# Patient Record
Sex: Male | Born: 1935 | Hispanic: No | Smoking: Current every day smoker
Health system: Southern US, Community
[De-identification: ages and names within clinical notes are randomized; demographics above are authoritative.]

---

## 2019-04-24 ENCOUNTER — Other Ambulatory Visit: Payer: Self-pay

## 2019-04-24 ENCOUNTER — Emergency Department (HOSPITAL_COMMUNITY): Payer: Medicare Other

## 2019-04-24 ENCOUNTER — Encounter (HOSPITAL_COMMUNITY): Payer: Self-pay

## 2019-04-24 ENCOUNTER — Inpatient Hospital Stay (HOSPITAL_COMMUNITY)
Admission: EM | Admit: 2019-04-24 | Discharge: 2019-04-26 | DRG: 190 | Disposition: A | Discharge status: Home / Self Care | Payer: Medicare Other | Attending: Internal Medicine | Admitting: Internal Medicine

## 2019-04-24 DIAGNOSIS — I2510 Atherosclerotic heart disease of native coronary artery without angina pectoris: Secondary | ICD-10-CM | POA: Diagnosis present

## 2019-04-24 DIAGNOSIS — J441 Chronic obstructive pulmonary disease with (acute) exacerbation: Secondary | ICD-10-CM | POA: Diagnosis not present

## 2019-04-24 DIAGNOSIS — R0902 Hypoxemia: Secondary | ICD-10-CM

## 2019-04-24 DIAGNOSIS — M8458XA Pathological fracture in neoplastic disease, other specified site, initial encounter for fracture: Secondary | ICD-10-CM | POA: Diagnosis present

## 2019-04-24 DIAGNOSIS — Z20828 Contact with and (suspected) exposure to other viral communicable diseases: Secondary | ICD-10-CM | POA: Diagnosis present

## 2019-04-24 DIAGNOSIS — D509 Iron deficiency anemia, unspecified: Secondary | ICD-10-CM | POA: Diagnosis present

## 2019-04-24 DIAGNOSIS — C3491 Malignant neoplasm of unspecified part of right bronchus or lung: Secondary | ICD-10-CM | POA: Diagnosis not present

## 2019-04-24 DIAGNOSIS — F17200 Nicotine dependence, unspecified, uncomplicated: Secondary | ICD-10-CM | POA: Diagnosis present

## 2019-04-24 DIAGNOSIS — C3411 Malignant neoplasm of upper lobe, right bronchus or lung: Secondary | ICD-10-CM | POA: Diagnosis present

## 2019-04-24 DIAGNOSIS — J9601 Acute respiratory failure with hypoxia: Secondary | ICD-10-CM

## 2019-04-24 LAB — BASIC METABOLIC PANEL WITH GFR
Anion gap: 13 (ref 5–15)
BUN: 27 mg/dL — ABNORMAL HIGH (ref 8–23)
CO2: 26 mmol/L (ref 22–32)
Calcium: 8.7 mg/dL — ABNORMAL LOW (ref 8.9–10.3)
Chloride: 96 mmol/L — ABNORMAL LOW (ref 98–111)
Creatinine, Ser: 0.92 mg/dL (ref 0.61–1.24)
GFR calc Af Amer: >60 mL/min
GFR calc non Af Amer: >60 mL/min
Glucose, Bld: 141 mg/dL — ABNORMAL HIGH (ref 70–99)
Potassium: 4.3 mmol/L (ref 3.5–5.1)
Sodium: 135 mmol/L (ref 135–145)

## 2019-04-24 LAB — CBC WITH DIFFERENTIAL/PLATELET
Abs Immature Granulocytes: 0.07 10*3/uL (ref 0.00–0.07)
Basophils Absolute: 0.1 10*3/uL (ref 0.0–0.1)
Basophils Relative: 1 %
Eosinophils Absolute: 0.3 10*3/uL (ref 0.0–0.5)
Eosinophils Relative: 3 %
HCT: 33 % — ABNORMAL LOW (ref 39.0–52.0)
Hemoglobin: 9.9 g/dL — ABNORMAL LOW (ref 13.0–17.0)
Immature Granulocytes: 1 %
Lymphocytes Relative: 9 %
Lymphs Abs: 1 10*3/uL (ref 0.7–4.0)
MCH: 21.6 pg — ABNORMAL LOW (ref 26.0–34.0)
MCHC: 30 g/dL (ref 30.0–36.0)
MCV: 71.9 fL — ABNORMAL LOW (ref 80.0–100.0)
Monocytes Absolute: 1.1 10*3/uL — ABNORMAL HIGH (ref 0.1–1.0)
Monocytes Relative: 10 %
Neutro Abs: 8.9 10*3/uL — ABNORMAL HIGH (ref 1.7–7.7)
Neutrophils Relative %: 76 %
Platelets: 353 10*3/uL (ref 150–400)
RBC: 4.59 MIL/uL (ref 4.22–5.81)
RDW: 16.9 % — ABNORMAL HIGH (ref 11.5–15.5)
WBC: 11.5 10*3/uL — ABNORMAL HIGH (ref 4.0–10.5)
nRBC: 0 % (ref 0.0–0.2)

## 2019-04-24 LAB — SARS CORONAVIRUS 2 BY RT PCR (HOSPITAL ORDER, PERFORMED IN ~~LOC~~ HOSPITAL LAB): SARS Coronavirus 2: NEGATIVE

## 2019-04-24 LAB — TROPONIN I: Troponin I: <0.03 ng/mL

## 2019-04-24 MED ORDER — IPRATROPIUM BROMIDE HFA 17 MCG/ACT IN AERS
2.0000 | INHALATION_SPRAY | Freq: Once | RESPIRATORY_TRACT | Status: AC
  Administered 2019-04-24: 10:00:00 2 via RESPIRATORY_TRACT
  Filled 2019-04-24: qty 12.9

## 2019-04-24 MED ORDER — METHYLPREDNISOLONE SODIUM SUCC 40 MG IJ SOLR
40.0000 mg | Freq: Two times a day (BID) | INTRAMUSCULAR | Status: DC
  Administered 2019-04-24 – 2019-04-26 (×4): 40 mg via INTRAVENOUS
  Filled 2019-04-24 (×4): qty 1

## 2019-04-24 MED ORDER — METHYLPREDNISOLONE SODIUM SUCC 125 MG IJ SOLR
125.0000 mg | Freq: Once | INTRAMUSCULAR | Status: AC
  Administered 2019-04-24: 11:00:00 125 mg via INTRAVENOUS
  Filled 2019-04-24: qty 2

## 2019-04-24 MED ORDER — VITAMIN D 25 MCG (1000 UNIT) PO TABS
1000.0000 [IU] | ORAL_TABLET | Freq: Every day | ORAL | Status: DC
  Administered 2019-04-24 – 2019-04-26 (×3): 1000 [IU] via ORAL
  Filled 2019-04-24 (×3): qty 1

## 2019-04-24 MED ORDER — ACETAMINOPHEN 325 MG PO TABS
650.0000 mg | ORAL_TABLET | Freq: Four times a day (QID) | ORAL | Status: DC | PRN
  Administered 2019-04-25: 21:00:00 650 mg via ORAL
  Filled 2019-04-24: qty 2

## 2019-04-24 MED ORDER — ALBUTEROL SULFATE HFA 108 (90 BASE) MCG/ACT IN AERS
2.0000 | INHALATION_SPRAY | Freq: Once | RESPIRATORY_TRACT | Status: AC
  Administered 2019-04-24: 10:00:00 2 via RESPIRATORY_TRACT
  Filled 2019-04-24: qty 6.7

## 2019-04-24 MED ORDER — AZITHROMYCIN 250 MG PO TABS
250.0000 mg | ORAL_TABLET | Freq: Every day | ORAL | Status: DC
  Administered 2019-04-25 – 2019-04-26 (×2): 250 mg via ORAL
  Filled 2019-04-24 (×2): qty 1

## 2019-04-24 MED ORDER — IOHEXOL 300 MG/ML  SOLN: 75.0000 mL | Freq: Once | INTRAMUSCULAR | Status: DC | PRN

## 2019-04-24 MED ORDER — ALBUTEROL SULFATE (2.5 MG/3ML) 0.083% IN NEBU
2.5000 mg | INHALATION_SOLUTION | RESPIRATORY_TRACT | Status: DC | PRN
  Administered 2019-04-24: 16:00:00 2.5 mg via RESPIRATORY_TRACT
  Filled 2019-04-24: qty 3

## 2019-04-24 MED ORDER — ACETAMINOPHEN 650 MG RE SUPP: 650.0000 mg | Freq: Four times a day (QID) | RECTAL | Status: DC | PRN

## 2019-04-24 MED ORDER — IPRATROPIUM-ALBUTEROL 0.5-2.5 (3) MG/3ML IN SOLN
3.0000 mL | Freq: Four times a day (QID) | RESPIRATORY_TRACT | Status: DC
  Administered 2019-04-24 – 2019-04-26 (×8): 3 mL via RESPIRATORY_TRACT
  Filled 2019-04-24 (×8): qty 3

## 2019-04-24 MED ORDER — IOHEXOL 300 MG/ML  SOLN
75.0000 mL | Freq: Once | INTRAMUSCULAR | Status: AC | PRN
  Administered 2019-04-24: 12:00:00 75 mL via INTRAVENOUS

## 2019-04-24 MED ORDER — AZITHROMYCIN 250 MG PO TABS
500.0000 mg | ORAL_TABLET | Freq: Every day | ORAL | Status: AC
  Administered 2019-04-24: 18:00:00 500 mg via ORAL
  Filled 2019-04-24: qty 2

## 2019-04-24 MED ORDER — AEROCHAMBER PLUS FLO-VU MEDIUM MISC
1.0000 | Freq: Once | Status: AC
  Administered 2019-04-24: 10:00:00 1
  Filled 2019-04-24: qty 1

## 2019-04-24 MED ORDER — IPRATROPIUM-ALBUTEROL 0.5-2.5 (3) MG/3ML IN SOLN
3.0000 mL | Freq: Once | RESPIRATORY_TRACT | Status: AC
  Administered 2019-04-24: 12:00:00 3 mL via RESPIRATORY_TRACT
  Filled 2019-04-24: qty 3

## 2019-04-24 NOTE — ED Notes (Signed)
Call to resp 

## 2019-04-24 NOTE — ED Provider Notes (Signed)
Medical screening examination/treatment/procedure(s) were conducted as a shared visit with non-physician practitioner(s) and myself.  I personally evaluated the patient during the encounter.  EKG Interpretation  Date/Time:  Sunday Apr 24 2019 09:18:47 EDT Ventricular Rate:  104 PR Interval:    QRS Duration: 89 QT Interval:  339 QTC Calculation: 446 R Axis:   81 Text Interpretation:  Sinus tachycardia Borderline right axis deviation No previous ECGs available Interpretation limited secondary to artifact Confirmed by Fredia Sorrow [phone]) on 04/24/2019 9:24:09 AM   Patient seen by me along with physician assistant.  Patient is moved up here from [place] to be with family about 3 weeks ago.  Is been having a cough and shortness of breath for a while got worse last night.  Initially our thoughts were that patient may be a COPD exacerbation.  But chest x-ray raise significant concern for a neoplastic process for postobstructive pneumonia or both.  Patient nontoxic no acute distress.  COVID testing was negative.  Patient did have an oxygen requirement not normally on oxygen.  His room air sats of particular with ambulation were as low as 75%.  On 2-1/2 L oxygen levels are around 95%.  Patient much more comfortable.  Patient was ago given albuterol inhaler treatments.  Based on the chest x-ray results.  Patient will undergo CT of the chest with contrast just to give further evaluation about the neoplastic possible process.  Patient will require admission because of the hypoxia.  Patient currently comfortable nontoxic.   Fredia Sorrow, MD 04/24/19 [phone]

## 2019-04-24 NOTE — ED Notes (Signed)
Meal provided 

## 2019-04-24 NOTE — ED Notes (Signed)
Bed ready  Call for report   RN will call back

## 2019-04-24 NOTE — ED Notes (Signed)
Report to [place], [place]

## 2019-04-24 NOTE — ED Triage Notes (Signed)
Pt reports is here from [place].  Has been here for approx 3 weeks.  Reports no cough but sob for " a while" but worse last night.  Denies fever.

## 2019-04-24 NOTE — ED Notes (Signed)
Call to Daughter Minoru Chap [phone]  To provide room number and number to floor for updates on her father

## 2019-04-24 NOTE — ED Notes (Signed)
hospitalist in to assess/discuss findings

## 2019-04-24 NOTE — ED Notes (Signed)
From CT 

## 2019-04-24 NOTE — ED Provider Notes (Signed)
[facility] EMERGENCY DEPARTMENT Provider Note   CSN: [id number] Arrival date & time: 04/24/19  0909    History   Chief Complaint Chief Complaint  Patient presents with  . Shortness of Breath    HPI Charles Stafford is a 85 y.o. male who presents with SOB. PMH is unknown (pt states he doesn't take any medicines and is not diagnosed with anything). He is an overall poor historian and has some dysarthria which limits my history.  He came to [facility] about 3 weeks ago from [place]. He is currently living with his daughter, Charles Stafford. He reports he is here because he is SOB. He states he is always SOB and has a chronic cough. This has been worsening so he came to the ED today. He denies fever. He has chest pain with coughing. He smokes about 3-4 packs a day. Charles Stafford states that he has been complaining about a pain over his right side, in the axillary region. She has been giving him Aleve for this. He has been having increasing difficulty walking because of SOB and last night he couldn't get from the commode to his bed without a lot of difficulty. He also has a decreased appetite and is only drinking milk. She is unsure of his medical history because she is meeting him for the first time.   HPI  History reviewed. No pertinent past medical history.  There are no active problems to display for this patient.   History reviewed. No pertinent surgical history.      Home Medications    Prior to Admission medications   Not on File    Family History No family history on file.  Social History Social History   Tobacco Use  . Smoking status: Current Every Day Smoker  . Smokeless tobacco: Never Used  Substance Use Topics  . Alcohol use: Never    Frequency: Never  . Drug use: Never     Allergies   Patient has no known allergies.   Review of Systems Review of Systems  Constitutional: Positive for appetite change. Negative for fever.  Respiratory: Positive for cough, shortness of  breath and wheezing.   Cardiovascular: Positive for chest pain. Negative for palpitations and leg swelling.  Gastrointestinal: Negative for abdominal pain, nausea and vomiting.  Neurological: Negative for headaches.  All other systems reviewed and are negative.    Physical Exam Updated Vital Signs BP (!) 136/57 (BP Location: Left Arm)   Pulse 99   Temp 98 F (36.7 C) (Oral)   Resp (!) 27   SpO2 95%   Physical Exam Vitals signs and nursing note reviewed.  Constitutional:      General: He is not in acute distress.    Appearance: He is well-developed. He is ill-appearing (chronically ill appearing).     Comments: Alert, calm, cooperative. Speaks english fluently with significant dysarthria  HENT:     Head: Normocephalic and atraumatic.     Mouth/Throat:     Comments: Poor dentition Eyes:     General: No scleral icterus.       Right eye: No discharge.        Left eye: No discharge.     Conjunctiva/sclera: Conjunctivae normal.     Pupils: Pupils are equal, round, and reactive to light.  Neck:     Musculoskeletal: Normal range of motion.  Cardiovascular:     Rate and Rhythm: Normal rate and regular rhythm.  Pulmonary:     Effort: Pulmonary effort is normal. No  respiratory distress.     Breath sounds: Wheezing (diffuse wheezing, decreased air movement) present.  Abdominal:     General: There is no distension.  Skin:    General: Skin is warm and dry.  Neurological:     Mental Status: He is alert and oriented to person, place, and time.  Psychiatric:        Behavior: Behavior normal.      ED Treatments / Results  Labs (all labs ordered are listed, but only abnormal results are displayed) Labs Reviewed  CBC WITH DIFFERENTIAL/PLATELET - Abnormal; Notable for the following components:      Result Value   WBC 11.5 (*)    Hemoglobin 9.9 (*)    HCT 33.0 (*)    MCV 71.9 (*)    MCH 21.6 (*)    RDW 16.9 (*)    Neutro Abs 8.9 (*)    Monocytes Absolute 1.1 (*)    All  other components within normal limits  SARS CORONAVIRUS 2 (HOSPITAL ORDER, [place] [facility])  BASIC METABOLIC PANEL  TROPONIN I    EKG EKG Interpretation  Date/Time:  Sunday Apr 24 2019 09:18:47 EDT Ventricular Rate:  104 PR Interval:    QRS Duration: 89 QT Interval:  339 QTC Calculation: 446 R Axis:   81 Text Interpretation:  Sinus tachycardia Borderline right axis deviation No previous ECGs available Interpretation limited secondary to artifact Confirmed by Fredia Sorrow [phone]) on 04/24/2019 9:24:09 AM   Radiology No results found.  Procedures Procedures (including critical care time)  Medications Ordered in ED Medications  albuterol (VENTOLIN HFA) 108 (90 Base) MCG/ACT inhaler 2 puff (has no administration in time range)  ipratropium (ATROVENT HFA) inhaler 2 puff (has no administration in time range)  AeroChamber Plus Flo-Vu Medium MISC 1 each (has no administration in time range)     Initial Impression / Assessment and Plan / ED Course  I have reviewed the triage vital signs and the nursing notes.  Pertinent labs & imaging results that were available during my care of the patient were reviewed by me and considered in my medical decision making (see chart for details).  85 year old male presents with acute on chronic SOB with respiratory failure.  Initial O2 sat 75% in triage and is placed on 2 L via nasal cannula.  On exam heart is regular rate and rhythm.  Lungs reveal diffuse wheezing with decreased breath sounds.  He is chronically ill-appearing although denies any medical problems.  I did discuss with his daughter after patient gave consent and she states that she does not know much about his medical history as she is just met him for the first time about 3 weeks ago.  She does mention that the patient does not go to the hospital often and that he smokes 3 to 4 packs of cigarettes daily.  Will obtain chest x-ray, EKG, blood work.  Will obtain  COVID test as I anticipate admission.  EKG is sinus tachycardia.  Chest x-ray shows right upper lobe opacity versus mass.  CT with contrast was ordered to further characterize.  He was given breathing treatment and IV steroids and feels better after this.  CBC shows mild leukocytosis and moderate anemia.  BMP is overall unremarkable.  Troponin is normal.  COVID test was negative.  Shared visit with Dr. Rogene Houston.  His daughter was updated regarding plan of care.  I spoke with Dr. Florene Glen who will admit.  Final Clinical Impressions(s) / ED  Diagnoses   Final diagnoses:  COPD exacerbation ([place])  Hypoxia  Malignant neoplasm of right lung, unspecified part of lung [facility])    ED Discharge Orders    None       Recardo Evangelist, PA-C 04/24/19 1433    Fredia Sorrow, MD 04/24/19 1501

## 2019-04-24 NOTE — ED Notes (Signed)
To CT

## 2019-04-24 NOTE — H&P (Signed)
History and Physical    Charles Stafford:[id number] DOB: 1933-07-27 DOA: 04/24/2019  PCP: Patient, No Pcp Per Patient coming from: home  I have personally briefly reviewed patient's old medical records in [place]  Chief Complaint: SOB  HPI: Charles Stafford is Charles Stafford 85 y.o. male with medical history significant of tobacco abuse, but otherwise without known medical history.   He notes that he's had chronic SOB that worsened last night.  He denies fevers, chest pain, sick contacts, abdominal pain, or other concerns at this time.  He notes chronic smoking history.  I discussed with his daughter as well who notes he's presenting due to worsening shortness of breath.  He's visiting from out of town and it seems like this may be one of the first times she's met him so his baseline is unknown to her.   ED Course: Labs, EKG, CXR, CT scan.  Admit for COPD exacerbation and likely lung cancer.  Review of Systems: As per HPI otherwise 10 point review of systems negative.   History reviewed. No pertinent past medical history.  History reviewed. No pertinent surgical history.   reports that he has been smoking cigarettes. He has never used smokeless tobacco. He reports that he does not drink alcohol or use drugs.  No Known Allergies  History reviewed. No pertinent family history.  Prior to Admission medications   Medication Sig Start Date End Date Taking? Authorizing Provider  naproxen sodium (ALEVE) 220 MG tablet Take 220 mg by mouth 2 (two) times daily as needed (pain).   Yes [provider]    Physical Exam: Vitals:   04/24/19 1430 04/24/19 1504 04/24/19 1511 04/24/19 1606  BP: 123/60 (!) 133/54    Pulse: 92 81    Resp:  18    Temp:  97.7 F (36.5 C)    TempSrc:  Oral    SpO2: 94% 97%  94%  Weight:   49.4 kg   Height:   _0  (1.27 m)     Constitutional: NAD, calm, comfortable, elderly hispanic gentleman  Vitals:   04/24/19 1430 04/24/19 1504 04/24/19 1511  04/24/19 1606  BP: 123/60 (!) 133/54    Pulse: 92 81    Resp:  18    Temp:  97.7 F (36.5 C)    TempSrc:  Oral    SpO2: 94% 97%  94%  Weight:   49.4 kg   Height:   _1  (1.27 m)    Eyes: PERRL, lids and conjunctivae normal ENMT: Mucous membranes are moist. Neck: normal, supple, no masses, no thyromegaly Respiratory: good air movement, mild wheezing  Cardiovascular: Regular rate and rhythm, no murmurs / rubs / gallops. No extremity edema. No carotid bruits.  Abdomen: no tenderness, no masses palpated. No hepatosplenomegaly. Bowel sounds positive.  Musculoskeletal: no clubbing / cyanosis. No joint deformity upper and lower extremities. Good ROM, no contractures. Normal muscle tone.  Skin: no rashes, lesions, ulcers. No induration Neurologic: CN 2-12 grossly intact. Sensation intact. Moving all extremities.  Psychiatric: Normal judgment and insight. Alert and oriented x 3. Normal mood.   Labs on Admission: I have personally reviewed following labs and imaging studies  CBC: Recent Labs  Lab 04/24/19 0934  WBC 11.5*  NEUTROABS 8.9*  HGB 9.9*  HCT 33.0*  MCV 71.9*  PLT 811   Basic Metabolic Panel: Recent Labs  Lab 04/24/19 0934  NA 135  K 4.3  CL 96*  CO2 26  GLUCOSE 141*  BUN 27*  CREATININE  0.92  CALCIUM 8.7*   GFR: Estimated Creatinine Clearance: 31 mL/min (by C-G formula based on SCr of 0.92 mg/dL). Liver Function Tests: No results for input(s): AST, ALT, ALKPHOS, BILITOT, PROT, ALBUMIN in the last 168 hours. No results for input(s): LIPASE, AMYLASE in the last 168 hours. No results for input(s): AMMONIA in the last 168 hours. Coagulation Profile: No results for input(s): INR, PROTIME in the last 168 hours. Cardiac Enzymes: Recent Labs  Lab 04/24/19 0934  TROPONINI <0.03   BNP (last 3 results) No results for input(s): PROBNP in the last 8760 hours. HbA1C: No results for input(s): HGBA1C in the last 72 hours. CBG: No results for input(s): GLUCAP in the  last 168 hours. Lipid Profile: No results for input(s): CHOL, HDL, LDLCALC, TRIG, CHOLHDL, LDLDIRECT in the last 72 hours. Thyroid Function Tests: No results for input(s): TSH, T4TOTAL, FREET4, T3FREE, THYROIDAB in the last 72 hours. Anemia Panel: No results for input(s): VITAMINB12, FOLATE, FERRITIN, TIBC, IRON, RETICCTPCT in the last 72 hours. Urine analysis: No results found for: COLORURINE, APPEARANCEUR, Victoria Vera, [place], GLUCOSEU, HGBUR, BILIRUBINUR, KETONESUR, PROTEINUR, UROBILINOGEN, NITRITE, LEUKOCYTESUR  Radiological Exams on Admission: Ct Chest W Contrast  Result Date: 04/24/2019 CLINICAL DATA:  Shortness of breath, worsening last night. EXAM: CT CHEST WITH CONTRAST TECHNIQUE: Multidetector CT imaging of the chest was performed during intravenous contrast administration. CONTRAST:  34m OMNIPAQUE IOHEXOL 300 MG/ML  SOLN COMPARISON:  Chest x-ray from earlier same day. FINDINGS: Cardiovascular: Aortic atherosclerosis. No thoracic aortic aneurysm or evidence of aortic dissection. 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 Heart size is within normal limits. No pericardial effusion. Diffuse coronary artery calcifications. Mediastinum/Nodes: Esophagus is unremarkable. Trachea and central bronchi are unremarkable. Additional description below. Lungs/Pleura: Solid RIGHT upper lobe mass measures 7.1 x 6.6 x 6.5 cm (AP by transverse by craniocaudal dimensions). The RIGHT upper lobe mass appears to invade the RIGHT suprahilar mediastinum, with associated soft tissue material in the RIGHT lower paratracheal space (series 2, image 60). Low-density mass in the upper RIGHT hilum measures 2 cm short axis dimension, likely metastatic lymph node. No enlarged lymph nodes seen in the LEFT hilum or elsewhere within the mediastinum. Central lobular emphysematous changes bilaterally, moderate in degree, upper lobe predominant. No additional nodule or mass  appreciated within either lung. Upper Abdomen: 7 mm low-density lesion within the LEFT liver lobe, segment 3 (series 2, image 156), too small to definitively characterize. No other liver lesion identified, although the liver is incompletely imaged. Limited images of the upper abdomen are otherwise unremarkable. Musculoskeletal: Old healed rib fractures on the RIGHT. Compression fracture deformities of the T3, T7 and T9 vertebral bodies, of uncertain age but most likely chronic based on appearance. Pathologic fracture related to underlying osseous metastases not excluded. IMPRESSION: 1. Solid RIGHT upper lobe mass measuring 7.1 x 6.6 x 6.5 cm, consistent with neoplastic mass, highly suspicious for primary bronchogenic carcinoma. The mass invades the RIGHT suprahilar mediastinum, with associated soft tissue material in the RIGHT lower paratracheal space. 2. 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 3. 7 mm low-density lesion within the left liver lobe, segment 3, too small to definitively characterize, more likely incidental cyst but single liver metastasis not excluded. 4. Compression fracture deformities of the T3, T7 and T9 vertebral bodies, of uncertain age but most likely chronic based on appearance. Pathologic fracture related to underlying osseous metastases not excluded. 5. Diffuse coronary  artery calcifications. Recommend correlation with any possible associated cardiac symptoms. Aortic Atherosclerosis (ICD10-I70.0) and Emphysema (ICD10-J43.9). Electronically Signed   By: Franki Cabot M.D.   On: 04/24/2019 12:30   Dg Chest Port 1 View  Result Date: 04/24/2019 CLINICAL DATA:  85 year old male with Bennie Scaff history of shortness of breath and no fever EXAM: PORTABLE CHEST 1 VIEW COMPARISON:  None. FINDINGS: Opacity in the right hilar/suprahilar region extending superiorly. No interlobular septal thickening. Blunting of the left costophrenic  angle. No pneumothorax. No significant left-sided airspace disease. No displaced fracture. IMPRESSION: Right hilar and suprahilar opacity. While infection remains on the differential, Mariluz Crespo mass is suspected given the absence of fever and further evaluation with contrast-enhanced chest CT is recommended. Electronically Signed   By: Corrie Mckusick D.O.   On: 04/24/2019 10:10    EKG: Independently reviewed. Sinus tachy. Poor baseline.  No prior EKGs for comparison.  Assessment/Plan Active Problems:   COPD exacerbation (HCC)  COPD exacerbation: No formal diagnosis of COPD, but significant smoking hx, multiple ppd.  IV steroids Scheduled and prn nebs  Azithromycin Will need controller medication at discharge Wean O2 for SpO2 88-92%  Right Upper Lobe Mass   Concern for metastatic lung cancer:  CT with RUL mass suspicious for primary bronchogenic carcinoma  Mass invades R suprahilar mediastinum with associated soft tissue material in R lower paratracheal space. Concern for LN metastasis in upper R hilum 6 mm low density lesion within L liver lobe, possible metastasis vs cyst Discussed with Dr. Luan Pulling who will consult   T3, T7, T9 Compression Fractures: appear chronic.  Possibly related to malignancy above. Follow vitamin D level Supplementation  Diffuse Coronary Artery Calcifications on imaging: pt without c/o CP.  Continue to follow outpatient.  Follow lipids, A1c  Anemia: follow iron panel, b12, folate  DVT prophylaxis: scd Code Status: dnr, discussed with pt  Family Communication: daughter  Disposition Plan: pending  Consults called: pulm, discussed with Dr. Luan Pulling  Admission status: telemetry   Fayrene Helper MD [facility] Pager AMION  If 7PM-7AM, please contact night-coverage [email or web address] Password [facility]  04/24/2019, 4:23 PM

## 2019-04-25 ENCOUNTER — Observation Stay (HOSPITAL_COMMUNITY): Payer: Medicare Other | Admitting: Anesthesiology

## 2019-04-25 ENCOUNTER — Encounter (HOSPITAL_COMMUNITY)
Admission: EM | Disposition: A | Discharge status: Home / Self Care | Payer: Self-pay | Source: Home / Self Care | Attending: Internal Medicine

## 2019-04-25 ENCOUNTER — Encounter (HOSPITAL_COMMUNITY): Payer: Self-pay

## 2019-04-25 DIAGNOSIS — I2510 Atherosclerotic heart disease of native coronary artery without angina pectoris: Secondary | ICD-10-CM | POA: Diagnosis present

## 2019-04-25 DIAGNOSIS — C3491 Malignant neoplasm of unspecified part of right bronchus or lung: Secondary | ICD-10-CM | POA: Diagnosis not present

## 2019-04-25 DIAGNOSIS — J441 Chronic obstructive pulmonary disease with (acute) exacerbation: Secondary | ICD-10-CM | POA: Diagnosis present

## 2019-04-25 DIAGNOSIS — M8458XA Pathological fracture in neoplastic disease, other specified site, initial encounter for fracture: Secondary | ICD-10-CM | POA: Diagnosis present

## 2019-04-25 DIAGNOSIS — C3411 Malignant neoplasm of upper lobe, right bronchus or lung: Secondary | ICD-10-CM | POA: Diagnosis present

## 2019-04-25 DIAGNOSIS — J9601 Acute respiratory failure with hypoxia: Secondary | ICD-10-CM | POA: Diagnosis present

## 2019-04-25 DIAGNOSIS — R0902 Hypoxemia: Secondary | ICD-10-CM | POA: Diagnosis present

## 2019-04-25 DIAGNOSIS — F17200 Nicotine dependence, unspecified, uncomplicated: Secondary | ICD-10-CM | POA: Diagnosis present

## 2019-04-25 DIAGNOSIS — D509 Iron deficiency anemia, unspecified: Secondary | ICD-10-CM | POA: Diagnosis present

## 2019-04-25 DIAGNOSIS — Z20828 Contact with and (suspected) exposure to other viral communicable diseases: Secondary | ICD-10-CM | POA: Diagnosis present

## 2019-04-25 HISTORY — PX: BRONCHIAL BRUSHINGS: SHX5108

## 2019-04-25 HISTORY — PX: BRONCHIAL WASHINGS: SHX5105

## 2019-04-25 HISTORY — PX: FLEXIBLE BRONCHOSCOPY: SHX5094

## 2019-04-25 LAB — LIPID PANEL
Cholesterol: 148 mg/dL (ref 0–200)
HDL: 49 mg/dL (ref >40)
LDL Cholesterol: 91 mg/dL (ref 0–99)
Total CHOL/HDL Ratio: 3 RATIO
Triglycerides: 40 mg/dL (ref <150)
VLDL: 8 mg/dL (ref 0–40)

## 2019-04-25 LAB — COMPREHENSIVE METABOLIC PANEL
ALT: 12 U/L (ref 0–44)
AST: 15 U/L (ref 15–41)
Albumin: 3 g/dL — ABNORMAL LOW (ref 3.5–5.0)
Alkaline Phosphatase: 121 U/L (ref 38–126)
Anion gap: 10 (ref 5–15)
BUN: 37 mg/dL — ABNORMAL HIGH (ref 8–23)
CO2: 26 mmol/L (ref 22–32)
Calcium: 8.7 mg/dL — ABNORMAL LOW (ref 8.9–10.3)
Chloride: 95 mmol/L — ABNORMAL LOW (ref 98–111)
Creatinine, Ser: 1.07 mg/dL (ref 0.61–1.24)
GFR calc Af Amer: >60 mL/min (ref >60)
GFR calc non Af Amer: >60 mL/min (ref >60)
Glucose, Bld: 162 mg/dL — ABNORMAL HIGH (ref 70–99)
Potassium: 4.6 mmol/L (ref 3.5–5.1)
Sodium: 131 mmol/L — ABNORMAL LOW (ref 135–145)
Total Bilirubin: 0.4 mg/dL (ref 0.3–1.2)
Total Protein: 6.6 g/dL (ref 6.5–8.1)

## 2019-04-25 LAB — CBC
HCT: 28.7 % — ABNORMAL LOW (ref 39.0–52.0)
Hemoglobin: 8.4 g/dL — ABNORMAL LOW (ref 13.0–17.0)
MCH: 21.1 pg — ABNORMAL LOW (ref 26.0–34.0)
MCHC: 29.3 g/dL — ABNORMAL LOW (ref 30.0–36.0)
MCV: 71.9 fL — ABNORMAL LOW (ref 80.0–100.0)
Platelets: 346 10*3/uL (ref 150–400)
RBC: 3.99 MIL/uL — ABNORMAL LOW (ref 4.22–5.81)
RDW: 16.4 % — ABNORMAL HIGH (ref 11.5–15.5)
WBC: 14.2 10*3/uL — ABNORMAL HIGH (ref 4.0–10.5)
nRBC: 0 % (ref 0.0–0.2)

## 2019-04-25 LAB — FOLATE: Folate: 4.9 ng/mL — ABNORMAL LOW (ref >5.9)

## 2019-04-25 LAB — IRON AND TIBC
Iron: 9 ug/dL — ABNORMAL LOW (ref 45–182)
Saturation Ratios: 2 % — ABNORMAL LOW (ref 17.9–39.5)
TIBC: 371 ug/dL (ref 250–450)
UIBC: 362 ug/dL

## 2019-04-25 LAB — FERRITIN: Ferritin: 8 ng/mL — ABNORMAL LOW (ref 24–336)

## 2019-04-25 LAB — VITAMIN B12: Vitamin B-12: 628 pg/mL (ref 180–914)

## 2019-04-25 SURGERY — BRONCHOSCOPY, FLEXIBLE
Anesthesia: Monitor Anesthesia Care | Laterality: Right

## 2019-04-25 MED ORDER — LIDOCAINE VISCOUS HCL 2 % MT SOLN
OROMUCOSAL | Status: DC | PRN
  Administered 2019-04-25: 10 mL via OROMUCOSAL

## 2019-04-25 MED ORDER — KETAMINE HCL 10 MG/ML IJ SOLN
INTRAMUSCULAR | Status: DC | PRN
  Administered 2019-04-25 (×2): 10 mg via INTRAVENOUS

## 2019-04-25 MED ORDER — PROMETHAZINE HCL 25 MG/ML IJ SOLN: 6.2500 mg | INTRAMUSCULAR | Status: DC | PRN

## 2019-04-25 MED ORDER — HYDROCODONE-ACETAMINOPHEN 7.5-325 MG PO TABS: 1.0000 | ORAL_TABLET | Freq: Once | ORAL | Status: DC | PRN

## 2019-04-25 MED ORDER — FOLIC ACID 1 MG PO TABS
1.0000 mg | ORAL_TABLET | Freq: Every day | ORAL | Status: DC
  Administered 2019-04-25 – 2019-04-26 (×2): 1 mg via ORAL
  Filled 2019-04-25 (×2): qty 1

## 2019-04-25 MED ORDER — ADULT MULTIVITAMIN W/MINERALS CH
1.0000 | ORAL_TABLET | Freq: Every day | ORAL | Status: DC
  Administered 2019-04-26: 09:00:00 1 via ORAL
  Filled 2019-04-25: qty 1

## 2019-04-25 MED ORDER — LIDOCAINE HCL (PF) 2 % IJ SOLN
INTRAMUSCULAR | Status: AC
  Filled 2019-04-25: qty 10

## 2019-04-25 MED ORDER — 0.9 % SODIUM CHLORIDE (POUR BTL) OPTIME
TOPICAL | Status: DC | PRN
  Administered 2019-04-25: 10:00:00 20 mL

## 2019-04-25 MED ORDER — LIDOCAINE VISCOUS HCL 2 % MT SOLN
OROMUCOSAL | Status: AC
  Filled 2019-04-25: qty 30

## 2019-04-25 MED ORDER — LACTATED RINGERS IV SOLN
INTRAVENOUS | Status: DC
  Administered 2019-04-25: 10:00:00 via INTRAVENOUS

## 2019-04-25 MED ORDER — BUTAMBEN-TETRACAINE-BENZOCAINE 2-2-14 % EX AERO
INHALATION_SPRAY | CUTANEOUS | Status: AC
  Filled 2019-04-25: qty 10

## 2019-04-25 MED ORDER — HYDROMORPHONE HCL 1 MG/ML IJ SOLN: 0.2500 mg | INTRAMUSCULAR | Status: DC | PRN

## 2019-04-25 MED ORDER — MIDAZOLAM HCL 2 MG/2ML IJ SOLN: 0.5000 mg | Freq: Once | INTRAMUSCULAR | Status: DC | PRN

## 2019-04-25 MED ORDER — PROPOFOL 500 MG/50ML IV EMUL: INTRAVENOUS | Status: DC | PRN

## 2019-04-25 MED ORDER — ENSURE ENLIVE PO LIQD: 237.0000 mL | Freq: Two times a day (BID) | ORAL | Status: DC

## 2019-04-25 MED ORDER — SODIUM CHLORIDE 0.9 % IV SOLN
510.0000 mg | Freq: Once | INTRAVENOUS | Status: AC
  Administered 2019-04-25: 17:00:00 510 mg via INTRAVENOUS
  Filled 2019-04-25: qty 17

## 2019-04-25 MED ORDER — KETAMINE HCL 50 MG/5ML IJ SOSY
PREFILLED_SYRINGE | INTRAMUSCULAR | Status: AC
  Filled 2019-04-25: qty 5

## 2019-04-25 MED ORDER — PROPOFOL 10 MG/ML IV BOLUS
INTRAVENOUS | Status: DC | PRN
  Administered 2019-04-25: 120 mg via INTRAVENOUS

## 2019-04-25 MED ORDER — PROPOFOL 10 MG/ML IV BOLUS
INTRAVENOUS | Status: AC
  Filled 2019-04-25: qty 20

## 2019-04-25 MED ORDER — SODIUM CHLORIDE 0.9 % IV SOLN
INTRAVENOUS | Status: AC
  Administered 2019-04-25 – 2019-04-26 (×2): via INTRAVENOUS

## 2019-04-25 SURGICAL SUPPLY — 16 items
BRUSH CYTOL CELLEBRITY 1.5X140 (MISCELLANEOUS) ×4 IMPLANT
CLOTH BEACON ORANGE TIMEOUT ST (SAFETY) ×4 IMPLANT
CONNECTOR 5 IN 1 STRAIGHT STRL (MISCELLANEOUS) ×4 IMPLANT
FORCEPS BIOP RJ4 1.8 (CUTTING FORCEPS) ×4 IMPLANT
GLOVE BIO SURGEON STRL SZ7.5 (GLOVE) ×4 IMPLANT
KIT CLEAN CATCH URINE (SET/KITS/TRAYS/PACK) IMPLANT
MARKER SKIN DUAL TIP RULER LAB (MISCELLANEOUS) ×4 IMPLANT
NS IRRIG 1000ML POUR BTL (IV SOLUTION) ×4 IMPLANT
SPONGE GAUZE 4X4 12PLY (GAUZE/BANDAGES/DRESSINGS) ×4 IMPLANT
SYR 20CC LL (SYRINGE) ×4 IMPLANT
SYR 30ML LL (SYRINGE) ×4 IMPLANT
SYR CONTROL 10ML LL (SYRINGE) ×4 IMPLANT
TRAP SPECIMEN CP (MISCELLANEOUS) ×4 IMPLANT
VALVE DISPOSABLE (MISCELLANEOUS) ×4 IMPLANT
WATER STERILE IRR 1000ML POUR (IV SOLUTION) ×4 IMPLANT
YANKAUER SUCT BULB TIP 10FT TU (MISCELLANEOUS) ×8 IMPLANT

## 2019-04-25 NOTE — Care Management Obs Status (Signed)
[place] NOTIFICATION   Patient Details  Name: Charles Stafford MRN: [id number] Date of Birth: [birth year]   Medicare Observation Status Notification Given:  Yes    Tommy Medal 04/25/2019, 11:42 AM

## 2019-04-25 NOTE — Plan of Care (Signed)
  Problem: Acute Rehab PT Goals(only PT should resolve) Goal: Patient Will Transfer Sit To/From Stand Outcome: Progressing Flowsheets (Taken 04/25/2019 0950) Patient will transfer sit to/from stand: Independently Goal: Pt Will Transfer Bed To Chair/Chair To Bed Outcome: Progressing Flowsheets (Taken 04/25/2019 0950) Pt will Transfer Bed to Chair/Chair to Bed: Independently Goal: Pt Will Ambulate Outcome: Progressing Flowsheets (Taken 04/25/2019 0950) Pt will Ambulate: > 125 feet; with supervision; with least restrictive assistive device Note:  With SPC Goal: Pt Will Go Up/Down Stairs Outcome: Progressing Flowsheets (Taken 04/25/2019 0950) Pt will Go Up / Down Stairs: 1-2 stairs; with cane; with rail(s); with supervision

## 2019-04-25 NOTE — Evaluation (Signed)
Physical Therapy Evaluation Patient Details Name: Charles Stafford MRN: [id number] DOB: June 11, 1933 Today's Date: 04/25/2019   History of Present Illness  Patient is an 85 year old who came to the emergency department because of increasing shortness of breath.  He got worse prior to admission on the night prior to admission.  He has a long smoking history and has been short of breath at home.  He has been coughing a little bit.  No hemoptysis.  He is very thin but he does not say that he is lost any weight.  He is unaware of any family history of lung disease or cancer.  He is not having any chest pain.  No nausea vomiting diarrhea.  No headaches    Clinical Impression  Charles Stafford is a 85 y.o. presenting for PT evaluation with recent decrease in functional mobility secondary to increased shortness of breath. He is currently functioning below his baseline of independent with SPC for mobiltiy, and now requires supervisionfor transfers and min guard to min assist for gait with use of RW. Patient's gait improved when pushing portable oxygen in Rt UE similar to [facility] and he maintained a wider base of support compared to ambulating with RW. No family was present to confirm history but patient reports he recently moved to [place] to live with his daughter and reports there are no stairs to enter the home or in the home. He was independent with cooking and cleaning prior to admission and remains slightly unsteady in standing at evaluation. He will benefit from skilled PT to address current impairments and from follow up [facility]. Acute PT will follow.     Follow Up Recommendations Home health PT    Equipment Recommendations  Cane(pt reports he had his cane in the hospital but cannot locate it now, he will need a SPC for ambulation)    Recommendations for Other Services       Precautions / Restrictions Precautions Precautions: Fall Restrictions Weight Bearing Restrictions: No      Mobility  Bed Mobility Overal bed mobility: Independent     Transfers Overall transfer level: Needs assistance Equipment used: Rolling walker (2 wheeled)(portable oxygen) Transfers: Sit to/from Stand Sit to Stand: Supervision   Ambulation/Gait Ambulation/Gait assistance: Min guard Gait Distance (Feet): 120 Feet Assistive device: Rolling walker (2 wheeled)(pushed portable oxygen) Gait Pattern/deviations: Step-through pattern;Scissoring;Narrow base of support;Trunk flexed;Decreased stride length;Decreased stance time - left     General Gait Details: pt reports 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 and he has reduced stance time on Lt LE, his gait was improved with pushing portable oxygen compared to RW      Balance Overall balance assessment: Needs assistance Sitting-balance support: No upper extremity supported;Feet unsupported Sitting balance-Leahy Scale: Normal Sitting balance - Comments: upon arrival pt is sitting at EOB adjusting his gown   Standing balance support: Bilateral upper extremity supported;Single extremity supported;During functional activity Standing balance-Leahy Scale: Fair Standing balance comment: pt requires single UE support to maintain balance during standing marching and gait activities            Pertinent Vitals/Pain Pain Assessment: No/denies pain    Home Living Family/patient expects to be discharged to:: Private residence Living Arrangements: Children Available Help at Discharge: Family Type of Home: House Home Access: Level entry     Home Layout: One level Home Equipment: Kasandra Knudsen - single point Additional Comments: pt reports he had a RW and wheelchair back in [place] but that he uses a SPC to  mobilize at home and in community and this is the only thing he has brought with him to [place] since moving    Prior Function Level of Independence: Independent with assistive device(s)        Hand Dominance   Dominant Hand: Right    Extremity/Trunk  Assessment   Upper Extremity Assessment Upper Extremity Assessment: Overall WFL for tasks assessed    Lower Extremity Assessment Lower Extremity Assessment: Overall WFL for tasks assessed    Cervical / Trunk Assessment Cervical / Trunk Assessment: Normal  Communication   Communication: No difficulties  Cognition Arousal/Alertness: Awake/alert Behavior During Therapy: WFL for tasks assessed/performed Overall Cognitive Status: Within Functional Limits for tasks assessed              Assessment/Plan    PT Assessment Patient needs continued PT services  PT Problem List Decreased activity tolerance;Decreased balance;Decreased mobility;Decreased safety awareness       PT Treatment Interventions DME instruction;Gait training;Therapeutic exercise;Balance training;Stair training;Functional mobility training;Therapeutic activities;Patient/family education    PT Goals (Current goals can be found in the Care Plan section)  Acute Rehab PT Goals Patient Stated Goal: no specific goal stated by patient discussed goal to return home and walk with cane again PT Goal Formulation: With patient Time For Goal Achievement: 05/02/19 Potential to Achieve Goals: Good    Frequency Min 3X/week    AM-PAC PT "6 Clicks" Mobility  Outcome Measure Help needed turning from your back to your side while in a flat bed without using bedrails?: None Help needed moving from lying on your back to sitting on the side of a flat bed without using bedrails?: None Help needed moving to and from a bed to a chair (including a wheelchair)?: A Little Help needed standing up from a chair using your arms (e.g., wheelchair or bedside chair)?: A Little Help needed to walk in hospital room?: A Little Help needed climbing 3-5 steps with a railing? : A Lot 6 Click Score: 19    End of Session Equipment Utilized During Treatment: Gait belt;Oxygen(Rw, 3L/min O2) Activity Tolerance: Patient tolerated treatment well Patient  left: in bed;with nursing/sitter in room;with call bell/phone within reach Nurse Communication: Mobility status PT Visit Diagnosis: Unsteadiness on feet (R26.81);Other abnormalities of gait and mobility (R26.89);History of falling (Z91.81)    Time: 1937-9024 PT Time Calculation (min) (ACUTE ONLY): 23 min   Charges:   PT Evaluation $PT Eval Moderate Complexity: 1 Mod PT Treatments $Gait Training: 8-22 mins        Kipp Brood, PT, DPT, [facility] Physical Therapist with [facility]  04/25/2019 9:49 AM

## 2019-04-25 NOTE — TOC Initial Note (Signed)
Transition of Care [facility]) - Initial/Assessment Note    Patient Details  Name: Charles Stafford MRN: [id number] Date of Birth: [birth year]  Transition of [facility]) CM/SW Contact:    Sherald Barge, RN Phone Number: 04/25/2019, 10:35 AM  Clinical Narrative:   Pt came from [place] two weeks ago to meet his children for the first time. He has been living with his daughter, Charles Stafford. CM called daughter for info as pt was in procedure at the time. Per Charles Stafford, pt wants to stay in [place] and will have family support between her and her brothers. Pt uses a cane with ambulation and has trouble walking because he is so short of breath. PT recommends HH PT, daughter feels pt would prefer OP PT. Plan pending discussion with pt:  - establish PCP - will call Dr. Denita Lung office tomorrow (he is spanish speaking) - make referral for OP PT to [facility]  - home O2 assessment               TOC staff will cont to follow and complete formal assessment with pt tomorrow.   Expected Discharge Plan: Home/Self Care    Expected Discharge Plan and Services Expected Discharge Plan: Home/Self Care       Living arrangements for the past 2 months: Single Family Home Expected Discharge Date: 04/26/19                     Prior Living Arrangements/Services Living arrangements for the past 2 months: Single Family Home Lives with:: Adult Children Patient language and need for interpreter reviewed:: Yes        Need for Family Participation in Patient Care: Yes (Comment) Care giver support system in place?: Yes (comment) Current home services: DME Criminal Activity/Legal Involvement Pertinent to Current Situation/Hospitalization: No - Comment as needed  Activities of Daily Living Home Assistive Devices/Equipment: Cane (specify quad or straight) ADL Screening (condition at time of admission) Patient's cognitive ability adequate to safely complete daily activities?: Yes Is the patient deaf or have difficulty  hearing?: No Does the patient have difficulty seeing, even when wearing glasses/contacts?: No Does the patient have difficulty concentrating, remembering, or making decisions?: No Patient able to express need for assistance with ADLs?: Yes Does the patient have difficulty dressing or bathing?: No Independently performs ADLs?: Yes (appropriate for developmental age) Does the patient have difficulty walking or climbing stairs?: No Weakness of Legs: Both Weakness of Arms/Hands: None     Emotional Assessment       Orientation: : Oriented to Self, Oriented to  Time, Oriented to Place, Oriented to Situation      Admission diagnosis:  Hypoxia [R09.02] COPD exacerbation ([place]) [J44.1] Malignant neoplasm of right lung, unspecified part of lung ([place]) [C34.91] Patient Active Problem List   Diagnosis Date Noted  . COPD exacerbation ([place]) 04/24/2019  . Hypoxia   . Malignant neoplasm of right lung ([place])    PCP:  Patient, No Pcp Per Pharmacy:   [facility], [address] [place] [address] Phone: [phone] Fax: [phone]      Readmission Risk Interventions Readmission Risk Prevention Plan 04/25/2019  Medication Screening Complete  Transportation Screening Complete

## 2019-04-25 NOTE — Transfer of Care (Signed)
Immediate Anesthesia Transfer of Care Note  Patient: Charles Stafford  Procedure(s) Performed: FLEXIBLE BRONCHOSCOPY (Bilateral ) BRONCHIAL WASHINGS (Right ) BRONCHIAL BRUSHINGS (Right )  Patient Location: PACU  Anesthesia Type:MAC  Level of Consciousness: awake  Airway & Oxygen Therapy: Patient Spontanous Breathing  Post-op Assessment: Report given to RN  Post vital signs: Reviewed and stable  Last Vitals:  Vitals Value Taken Time  BP 101/68 04/25/2019 11:00 AM  Temp    Pulse 99 04/25/2019 11:04 AM  Resp 22 04/25/2019 11:04 AM  SpO2 92 % 04/25/2019 11:04 AM  Vitals shown include unvalidated device data.  Last Pain:  Vitals:   04/25/19 1047  TempSrc:   PainSc: 0-No pain         Complications: No apparent anesthesia complications

## 2019-04-25 NOTE — Progress Notes (Signed)
Initial Nutrition Assessment  RD working remotely.  DOCUMENTATION CODES:   Obesity unspecified  INTERVENTION:  Provide Ensure Enlive po BID, each supplement provides 350 kcal and 20 grams of protein.  Provide daily MVI.  NUTRITION DIAGNOSIS:   Increased nutrient needs related to catabolic illness(COPD) as evidenced by estimated needs.  GOAL:   Patient will meet greater than or equal to 90% of their needs  MONITOR:   PO intake, Supplement acceptance, Labs, Weight trends, I & O's  REASON FOR ASSESSMENT:   Malnutrition Screening Tool    ASSESSMENT:   85 year old Spanish-speaking male with PMHx of tobacco abuse and presumptive COPD admitted with acute respiratory failure with hypoxia secondary to COPD exacerbation, right upper lobe mass, iron deficiency anemia, compression fractures at T3, T7, and T9.   Called interpreter line for assistance with obtaining nutrition/weight history from patient. Interpreter [phone]) called patient's room but patient did not answer. Per chart patient ate 100% of dinner last night. He was made NPO after midnight for bronchoscopy today, but diet was advanced to regular at 4108. There is no weight history in chart to trend.  Medications reviewed and include: azithromycin, vitamin D3 4854 units daily, folic acid 1 mg daily, Solu-Medrol 40 mg Q12hrs IV, NS @ 75 mL/hr.  Labs reviewed: Sodium 131, Chloride 95, BUN 37, Iron 9, TIBC 371, Ferritin 8, Folate 4.9, Vitamin B12 628.  Patient is at risk for malnutrition.  NUTRITION - FOCUSED PHYSICAL EXAM:  Unable to complete at this time.  Diet Order:   Diet Order            Diet regular Room service appropriate? Yes; Fluid consistency: Thin  Diet effective now             EDUCATION NEEDS:   No education needs have been identified at this time  Skin:  Skin Assessment: Reviewed RN Assessment  Last BM:  Unknown  Height:   Ht Readings from Last 1 Encounters:  04/24/19 4\' 2"  (1.27 m)    Weight:  Wt Readings from Last 1 Encounters:  04/25/19 49.4 kg   Ideal Body Weight:  37.9 kg  BMI:  Body mass index is 30.63 kg/m.  Estimated Nutritional Needs:   Kcal:  1500-1700  Protein:  75-85 grams  Fluid:  1.5-1.7 L/day  Willey Blade, MS, RD, LDN Office: [phone] Pager: [phone] After Hours/Weekend Pager: [phone]

## 2019-04-25 NOTE — Progress Notes (Signed)
PROGRESS NOTE  Charles Stafford HYW:[id number] DOB: 1933/08/28 DOA: 04/24/2019 PCP: Patient, No Pcp Per  Brief History:  85 year old male with a history of tobacco abuse and presumptive COPD presenting with 3-day history of worsening shortness of breath.  The patient has chronic shortness of breath at baseline, but this has worsened for 3 days.  He has had a nonproductive cough.  There is no fevers, chills, chest pain, nausea, vomiting, diarrhea.  He is in [place] visiting from [place], but according to the patient he is planning to stay here permanently with his family.  Upon presentation, the patient was afebrile hemodynamically stable.  BMP and LFTs were essentially unremarkable.  Hemoglobin was noted to be 9.9.  CT of the chest showed a right upper lobe mass measuring 7.1 x 6.6 x 6.5 cm concerning for neoplasm.  There was invasion of the right suprahilar and right lower paratracheal space.  There was T3, T7, T9 compression deformities likely chronic.  The patient was started on IV Solu-Medrol.  Pulmonary was consulted to assist with management.  The patient underwent bronchoscopy on 04/25/2019.  Assessment/Plan: Acute respiratory failure with hypoxia -Secondary COPD exacerbation -CT chest as discussed above -Continue duo nebs -Continue IV Solu-Medrol -Continue azithromycin -Wean oxygen as tolerated  COPD exacerbation -As discussed above  Right upper lobe lung mass -Appreciate pulmonary consult -04/25/2019--bronchoscopy--await pathology  Tobacco abuse -he continues to smoke I have discussed tobacco cessation with the patient.  I have counseled the patient regarding the negative impacts of continued tobacco use including but not limited to lung cancer, COPD, and cardiovascular disease.  I have discussed alternatives to tobacco and modalities that may help facilitate tobacco cessation including but not limited to biofeedback, hypnosis, and medications.  Total time spent with  tobacco counseling was 4 minutes.  Iron deficiency anemia -Iron saturation 2%, ferritin 8 -Folic acid 4.9 -Feraheme x1 -Start folic acid  T3, T7, T9 Compression Fractures:  -appear chronic.  Possibly related to malignancy above. -Follow vitamin D level--pending -Supplementation  Diffuse Coronary Artery Calcifications on imaging:  -pt without c/o CP.  Continue to follow outpatient.  -Follow lipids--LDL 91 -A1c--pending  LDL 91  Disposition Plan:   Home in 1-2 days  Family Communication:  Daughter updated on phone  Consultants:  pulmonary  Code Status:  FULL   DVT Prophylaxis:  SCDs   Procedures: As Listed in Progress Note Above  Antibiotics: Azithromycin 5/24>>>       Subjective: Patient denies fevers, chills, headache, chest pain,  nausea, vomiting, diarrhea, abdominal pain, dysuria, hematuria, hematochezia, and melena.  He has a nonproductive cough.  He states that his dyspnea is 50% better.   Objective: Vitals:   04/25/19 1011 04/25/19 1047 04/25/19 1100 04/25/19 1135  BP: (!) 122/55 103/88 101/68 (!) 104/40  Pulse: 63 84 93 70  Resp: 19 13 19  (!) 21  Temp: 97.6 F (36.4 C) (!) 97.5 F (36.4 C)  97.8 F (36.6 C)  TempSrc: Oral   Oral  SpO2: 96% 93% 95% 98%  Weight:      Height:        Intake/Output Summary (Last 24 hours) at 04/25/2019 1330 Last data filed at 04/25/2019 1049 Gross per 24 hour  Intake 680 ml  Output 900 ml  Net -220 ml   Weight change:  Exam:   General:  Pt is alert, follows commands appropriately, not in acute distress  HEENT: No icterus, No thrush, No neck mass,  [place]/AT  Cardiovascular: RRR, S1/S2, no rubs, no gallops  Respiratory: diminished breath sounds.  Bibasilar minor wheeze.  Bibasilar rales  Abdomen: Soft/+BS, non tender, non distended, no guarding  Extremities: No edema, No lymphangitis, No petechiae, No rashes, no synovitis   Data Reviewed: I have personally reviewed following labs and imaging studies  Basic Metabolic Panel: Recent Labs  Lab 04/24/19 0934 04/25/19 0420  NA 135 131*  K 4.3 4.6  CL 96* 95*  CO2 26 26  GLUCOSE 141* 162*  BUN 27* 37*  CREATININE 0.92 1.07  CALCIUM 8.7* 8.7*   Liver Function Tests: Recent Labs  Lab 04/25/19 0420  AST 15  ALT 12  ALKPHOS 121  BILITOT 0.4  PROT 6.6  ALBUMIN 3.0*   No results for input(s): LIPASE, AMYLASE in the last 168 hours. No results for input(s): AMMONIA in the last 168 hours. Coagulation Profile: No results for input(s): INR, PROTIME in the last 168 hours. CBC: Recent Labs  Lab 04/24/19 0934 04/25/19 0420  WBC 11.5* 14.2*  NEUTROABS 8.9*  --   HGB 9.9* 8.4*  HCT 33.0* 28.7*  MCV 71.9* 71.9*  PLT 353 346   Cardiac Enzymes: Recent Labs  Lab 04/24/19 0934  TROPONINI <0.03   BNP: Invalid input(s): POCBNP CBG: No results for input(s): GLUCAP in the last 168 hours. HbA1C: No results for input(s): HGBA1C in the last 72 hours. Urine analysis: No results found for: COLORURINE, APPEARANCEUR, LABSPEC, PHURINE, GLUCOSEU, HGBUR, BILIRUBINUR, KETONESUR, PROTEINUR, UROBILINOGEN, NITRITE, LEUKOCYTESUR Sepsis Labs: @LABRCNTIP (procalcitonin:4,lacticidven:4) ) Recent Results (from the past 240 hour(s))  SARS Coronavirus 2 (CEPHEID- Performed in [place] hospital lab), Hosp Order     Status: None   Collection Time: 04/24/19  9:36 AM  Result Value Ref Range Status   SARS Coronavirus 2 NEGATIVE NEGATIVE Final    Comment: (NOTE) If result is NEGATIVE SARS-CoV-2 target nucleic acids are NOT DETECTED. The SARS-CoV-2 RNA is generally detectable in upper and lower  respiratory specimens during the acute phase of infection. The lowest  concentration of SARS-CoV-2 viral copies this assay can detect is 250  copies / mL. A negative result does not preclude SARS-CoV-2 infection  and should not be used as the sole basis for treatment or other  patient management decisions.  A negative result may occur with  improper  specimen collection / handling, submission of specimen other  than nasopharyngeal swab, presence of viral mutation(s) within the  areas targeted by this assay, and inadequate number of viral copies  (<250 copies / mL). A negative result must be combined with clinical  observations, patient history, and epidemiological information. If result is POSITIVE SARS-CoV-2 target nucleic acids are DETECTED. The SARS-CoV-2 RNA is generally detectable in upper and lower  respiratory specimens dur ing the acute phase of infection.  Positive  results are indicative of active infection with SARS-CoV-2.  Clinical  correlation with patient history and other diagnostic information is  necessary to determine patient infection status.  Positive results do  not rule out bacterial infection or co-infection with other viruses. If result is PRESUMPTIVE POSTIVE SARS-CoV-2 nucleic acids MAY BE PRESENT.   A presumptive positive result was obtained on the submitted specimen  and confirmed on repeat testing.  While 2019 novel coronavirus  (SARS-CoV-2) nucleic acids may be present in the submitted sample  additional confirmatory testing may be necessary for epidemiological  and / or clinical management purposes  to differentiate between  SARS-CoV-2 and other Sarbecovirus currently known to infect humans.  If clinically indicated additional testing with an alternate test  methodology [phone]) is advised. The SARS-CoV-2 RNA is generally  detectable in upper and lower respiratory sp ecimens during the acute  phase of infection. The expected result is Negative. Fact Sheet for Patients:  StrictlyIdeas.no Fact Sheet for Healthcare Providers: BankingDealers.co.za This test is not yet approved or cleared by the [place] FDA and has been authorized for detection and/or diagnosis of SARS-CoV-2 by FDA under an Emergency Use Authorization (EUA).  This EUA will remain in  effect (meaning this test can be used) for the duration of the COVID-19 declaration under Section 564(b)(1) of the Act, 21 U.S.C. section 360bbb-3(b)(1), unless the authorization is terminated or revoked sooner. Performed at [facility], [address] [place], [place] [address]      Scheduled Meds: . azithromycin  250 mg Oral Daily  . cholecalciferol  1,000 Units Oral Daily  . ipratropium-albuterol  3 mL Nebulization Q6H  . methylPREDNISolone (SOLU-MEDROL) injection  40 mg Intravenous Q12H   Continuous Infusions:  Procedures/Studies: Ct Chest W Contrast  Result Date: 04/24/2019 CLINICAL DATA:  Shortness of breath, worsening last night. EXAM: CT CHEST WITH CONTRAST TECHNIQUE: Multidetector CT imaging of the chest was performed during intravenous contrast administration. CONTRAST:  21mL OMNIPAQUE IOHEXOL 300 MG/ML  SOLN COMPARISON:  Chest x-ray from earlier same day. FINDINGS: Cardiovascular: Aortic atherosclerosis. No thoracic aortic aneurysm or evidence of aortic dissection. 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 Heart size is within normal limits. No pericardial effusion. Diffuse coronary artery calcifications. Mediastinum/Nodes: Esophagus is unremarkable. Trachea and central bronchi are unremarkable. Additional description below. Lungs/Pleura: Solid RIGHT upper lobe mass measures 7.1 x 6.6 x 6.5 cm (AP by transverse by craniocaudal dimensions). The RIGHT upper lobe mass appears to invade the RIGHT suprahilar mediastinum, with associated soft tissue material in the RIGHT lower paratracheal space (series 2, image 60). Low-density mass in the upper RIGHT hilum measures 2 cm short axis dimension, likely metastatic lymph node. No enlarged lymph nodes seen in the LEFT hilum or elsewhere within the mediastinum. Central lobular emphysematous changes bilaterally, moderate in degree, upper lobe predominant. No additional nodule or mass  appreciated within either lung. Upper Abdomen: 7 mm low-density lesion within the LEFT liver lobe, segment 3 (series 2, image 156), too small to definitively characterize. No other liver lesion identified, although the liver is incompletely imaged. Limited images of the upper abdomen are otherwise unremarkable. Musculoskeletal: Old healed rib fractures on the RIGHT. Compression fracture deformities of the T3, T7 and T9 vertebral bodies, of uncertain age but most likely chronic based on appearance. Pathologic fracture related to underlying osseous metastases not excluded. IMPRESSION: 1. Solid RIGHT upper lobe mass measuring 7.1 x 6.6 x 6.5 cm, consistent with neoplastic mass, highly suspicious for primary bronchogenic carcinoma. The mass invades the RIGHT suprahilar mediastinum, with associated soft tissue material in the RIGHT lower paratracheal space. 2. 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 3. 7 mm low-density lesion within the left liver lobe, segment 3, too small to definitively characterize, more likely incidental cyst but single liver metastasis not excluded. 4. Compression fracture deformities of the T3, T7 and T9 vertebral bodies, of uncertain age but most likely chronic based on appearance. Pathologic fracture related to underlying osseous metastases not excluded. 5. Diffuse coronary artery calcifications. Recommend correlation with any possible associated cardiac symptoms. Aortic Atherosclerosis (ICD10-I70.0) and Emphysema (ICD10-J43.9). Electronically Signed  By: Franki Cabot M.D.   On: 04/24/2019 12:30   Dg Chest Port 1 View  Result Date: 04/24/2019 CLINICAL DATA:  85 year old male with a history of shortness of breath and no fever EXAM: PORTABLE CHEST 1 VIEW COMPARISON:  None. FINDINGS: Opacity in the right hilar/suprahilar region extending superiorly. No interlobular septal thickening. Blunting of the left costophrenic  angle. No pneumothorax. No significant left-sided airspace disease. No displaced fracture. IMPRESSION: Right hilar and suprahilar opacity. While infection remains on the differential, a mass is suspected given the absence of fever and further evaluation with contrast-enhanced chest CT is recommended. Electronically Signed   By: Corrie Mckusick D.O.   On: 04/24/2019 10:10    Orson Eva, DO  [facility] Pager [phone]  If 7PM-7AM, please contact night-coverage [email or web address] Password [email or web address] 04/25/2019, 1:30 PM   LOS: 0 days

## 2019-04-25 NOTE — Anesthesia Preprocedure Evaluation (Signed)
Anesthesia Evaluation  Patient identified by MRN, date of birth, ID band Patient awake    Reviewed: Allergy & Precautions, NPO status , Patient's Chart, lab work & pertinent test results  Airway Mallampati: II  TM Distance: >3 FB Neck ROM: Full    Dental no notable dental hx. (+) Missing, Poor Dentition   Pulmonary COPD, Current Smoker,    Pulmonary exam normal breath sounds clear to auscultation       Cardiovascular Exercise Tolerance: Good negative cardio ROS Normal cardiovascular examI Rhythm:Regular Rate:Normal  85 y/o without other h/o than COPD/smoker for Bronch for mass under MAC vs GETA as needed    Neuro/Psych negative neurological ROS  negative psych ROS   GI/Hepatic negative GI ROS, Neg liver ROS,   Endo/Other  negative endocrine ROS  Renal/GU negative Renal ROS  negative genitourinary   Musculoskeletal negative musculoskeletal ROS (+)   Abdominal   Peds negative pediatric ROS (+)  Hematology negative hematology ROS (+)   Anesthesia Other Findings   Reproductive/Obstetrics negative OB ROS                             Anesthesia Physical Anesthesia Plan  ASA: III and emergent  Anesthesia Plan: MAC   Post-op Pain Management:    Induction: Intravenous  PONV Risk Score and Plan:   Airway Management Planned:   Additional Equipment:   Intra-op Plan:   Post-operative Plan: Extubation in OR  Informed Consent: I have reviewed the patients History and Physical, chart, labs and discussed the procedure including the risks, benefits and alternatives for the proposed anesthesia with the patient or authorized representative who has indicated his/her understanding and acceptance.     Dental advisory given  Plan Discussed with:   Anesthesia Plan Comments: (D/w pt MAC vs GETA as needed  Plan Full PPE use )        Anesthesia Quick Evaluation

## 2019-04-25 NOTE — Consult Note (Signed)
Consult requested by: [facility] hospitalist, Dr. Powell/Tat Consult requested for: Abnormal chest CT  HPI: This is an 85 year old who came to the emergency department because of increasing shortness of breath.  He got worse prior to admission on the night prior to admission.  He has a long smoking history and has been short of breath at home.  He has been coughing a little bit.  No hemoptysis.  He is very thin but he does not say that he is lost any weight.  He is unaware of any family history of lung disease or cancer.  He is not having any chest pain.  No nausea vomiting diarrhea.  No headaches  History reviewed. No pertinent past medical history.  He is short of breath and I think he does have COPD but he does not have that diagnosis.  History reviewed. No pertinent family history.  No known family history of lung disease  Social History   Socioeconomic History  . Marital status: Legally Separated    Spouse name: Not on file  . Number of children: Not on file  . Years of education: Not on file  . Highest education level: Not on file  Occupational History  . Not on file  Social Needs  . Financial resource strain: Not on file  . Food insecurity:    Worry: Not on file    Inability: Not on file  . Transportation needs:    Medical: Not on file    Non-medical: Not on file  Tobacco Use  . Smoking status: Current Every Day Smoker    Types: Cigarettes  . Smokeless tobacco: Never Used  Substance and Sexual Activity  . Alcohol use: Never    Frequency: Never  . Drug use: Never  . Sexual activity: Not on file  Lifestyle  . Physical activity:    Days per week: Not on file    Minutes per session: Not on file  . Stress: Not on file  Relationships  . Social connections:    Talks on phone: Not on file    Gets together: Not on file    Attends religious service: Not on file    Active member of club or organization: Not on file    Attends meetings of clubs or organizations: Not on file     Relationship status: Not on file  Other Topics Concern  . Not on file  Social History Narrative  . Not on file  He has a 60-pack-year smoking history  ROS: Except as mentioned 10 point review of systems is negative    Objective: Vital signs in last 24 hours: Temp:  [97.7 F (36.5 C)-98.2 F (36.8 C)] 97.7 F (36.5 C) (05/25 0557) Pulse Rate:  [65-99] 65 (05/25 0557) Resp:  [16-29] 16 (05/25 0557) BP: (111-136)/(43-69) 111/43 (05/25 0557) SpO2:  [75 %-100 %] 96 % (05/25 0731) Weight:  [49.4 kg] 49.4 kg (05/25 0651) Weight change:     Intake/Output from previous day: 05/24 0701 - 05/25 0700 In: 480 [P.O.:480] Out: 750 [Urine:750]  PHYSICAL EXAM Constitutional: He is thin.  He is wearing oxygen.  Eyes: Pupils react EOMI.  Ears nose mouth and throat: Mucous membranes are moist.  Hearing is grossly normal.  Cardiovascular: His heart is regular with normal heart sounds.  Respiratory: Respiratory effort is increased and he has diminished breath sounds bilaterally.  Gastrointestinal: His abdomen is soft with no masses.  Skin: Warm and dry.  Neurological: No focal abnormalities.  Psychiatric: Normal mood and affect  Lab Results: Basic Metabolic Panel: Recent Labs    04/24/19 0934 04/25/19 0420  NA 135 131*  K 4.3 4.6  CL 96* 95*  CO2 26 26  GLUCOSE 141* 162*  BUN 27* 37*  CREATININE 0.92 1.07  CALCIUM 8.7* 8.7*   Liver Function Tests: Recent Labs    04/25/19 0420  AST 15  ALT 12  ALKPHOS 121  BILITOT 0.4  PROT 6.6  ALBUMIN 3.0*   No results for input(s): LIPASE, AMYLASE in the last 72 hours. No results for input(s): AMMONIA in the last 72 hours. CBC: Recent Labs    04/24/19 0934 04/25/19 0420  WBC 11.5* 14.2*  NEUTROABS 8.9*  --   HGB 9.9* 8.4*  HCT 33.0* 28.7*  MCV 71.9* 71.9*  PLT 353 346   Cardiac Enzymes: Recent Labs    04/24/19 0934  TROPONINI <0.03   BNP: No results for input(s): PROBNP in the last 72 hours. D-Dimer: No results for  input(s): DDIMER in the last 72 hours. CBG: No results for input(s): GLUCAP in the last 72 hours. Hemoglobin A1C: No results for input(s): HGBA1C in the last 72 hours. Fasting Lipid Panel: Recent Labs    04/25/19 0420  CHOL 148  HDL 49  LDLCALC 91  TRIG 40  CHOLHDL 3.0   Thyroid Function Tests: No results for input(s): TSH, T4TOTAL, FREET4, T3FREE, THYROIDAB in the last 72 hours. Anemia Panel: Recent Labs    04/25/19 0421  VITAMINB12 628  FOLATE 4.9*  FERRITIN 8*  TIBC 371  IRON 9*   Coagulation: No results for input(s): LABPROT, INR in the last 72 hours. Urine Drug Screen: Drugs of Abuse  No results found for: LABOPIA, COCAINSCRNUR, LABBENZ, AMPHETMU, THCU, LABBARB  Alcohol Level: No results for input(s): ETH in the last 72 hours. Urinalysis: No results for input(s): COLORURINE, LABSPEC, PHURINE, GLUCOSEU, HGBUR, BILIRUBINUR, KETONESUR, PROTEINUR, UROBILINOGEN, NITRITE, LEUKOCYTESUR in the last 72 hours.  Invalid input(s): APPERANCEUR Misc. Labs:   ABGS: No results for input(s): PHART, PO2ART, TCO2, HCO3 in the last 72 hours.  Invalid input(s): PCO2   MICROBIOLOGY: Recent Results (from the past 240 hour(s))  SARS Coronavirus 2 (CEPHEID- Performed in [facility] hospital lab), Hosp Order     Status: None   Collection Time: 04/24/19  9:36 AM  Result Value Ref Range Status   SARS Coronavirus 2 NEGATIVE NEGATIVE Final    Comment: (NOTE) If result is NEGATIVE SARS-CoV-2 target nucleic acids are NOT DETECTED. The SARS-CoV-2 RNA is generally detectable in upper and lower  respiratory specimens during the acute phase of infection. The lowest  concentration of SARS-CoV-2 viral copies this assay can detect is 250  copies / mL. A negative result does not preclude SARS-CoV-2 infection  and should not be used as the sole basis for treatment or other  patient management decisions.  A negative result may occur with  improper specimen collection / handling, submission  of specimen other  than nasopharyngeal swab, presence of viral mutation(s) within the  areas targeted by this assay, and inadequate number of viral copies  (<250 copies / mL). A negative result must be combined with clinical  observations, patient history, and epidemiological information. If result is POSITIVE SARS-CoV-2 target nucleic acids are DETECTED. The SARS-CoV-2 RNA is generally detectable in upper and lower  respiratory specimens dur ing the acute phase of infection.  Positive  results are indicative of active infection with SARS-CoV-2.  Clinical  correlation with patient history and other diagnostic information is  necessary to determine patient infection status.  Positive results do  not rule out bacterial infection or co-infection with other viruses. If result is PRESUMPTIVE POSTIVE SARS-CoV-2 nucleic acids MAY BE PRESENT.   A presumptive positive result was obtained on the submitted specimen  and confirmed on repeat testing.  While 2019 novel coronavirus  (SARS-CoV-2) nucleic acids may be present in the submitted sample  additional confirmatory testing may be necessary for epidemiological  and / or clinical management purposes  to differentiate between  SARS-CoV-2 and other Sarbecovirus currently known to infect humans.  If clinically indicated additional testing with an alternate test  methodology [phone]) is advised. The SARS-CoV-2 RNA is generally  detectable in upper and lower respiratory sp ecimens during the acute  phase of infection. The expected result is Negative. Fact Sheet for Patients:  StrictlyIdeas.no Fact Sheet for Healthcare Providers: BankingDealers.co.za This test is not yet approved or cleared by the [place] FDA and has been authorized for detection and/or diagnosis of SARS-CoV-2 by FDA under an Emergency Use Authorization (EUA).  This EUA will remain in effect (meaning this test can be used) for  the duration of the COVID-19 declaration under Section 564(b)(1) of the Act, 21 U.S.C. section 360bbb-3(b)(1), unless the authorization is terminated or revoked sooner. Performed at [facility], [address] [place], [place] [address]     Studies/Results: Ct Chest W Contrast  Result Date: 04/24/2019 CLINICAL DATA:  Shortness of breath, worsening last night. EXAM: CT CHEST WITH CONTRAST TECHNIQUE: Multidetector CT imaging of the chest was performed during intravenous contrast administration. CONTRAST:  6mL OMNIPAQUE IOHEXOL 300 MG/ML  SOLN COMPARISON:  Chest x-ray from earlier same day. FINDINGS: Cardiovascular: Aortic atherosclerosis. No thoracic aortic aneurysm or evidence of aortic dissection. 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 Heart size is within normal limits. No pericardial effusion. Diffuse coronary artery calcifications. Mediastinum/Nodes: Esophagus is unremarkable. Trachea and central bronchi are unremarkable. Additional description below. Lungs/Pleura: Solid RIGHT upper lobe mass measures 7.1 x 6.6 x 6.5 cm (AP by transverse by craniocaudal dimensions). The RIGHT upper lobe mass appears to invade the RIGHT suprahilar mediastinum, with associated soft tissue material in the RIGHT lower paratracheal space (series 2, image 60). Low-density mass in the upper RIGHT hilum measures 2 cm short axis dimension, likely metastatic lymph node. No enlarged lymph nodes seen in the LEFT hilum or elsewhere within the mediastinum. Central lobular emphysematous changes bilaterally, moderate in degree, upper lobe predominant. No additional nodule or mass appreciated within either lung. Upper Abdomen: 7 mm low-density lesion within the LEFT liver lobe, segment 3 (series 2, image 156), too small to definitively characterize. No other liver lesion identified, although the liver is incompletely imaged. Limited images of the upper abdomen are otherwise  unremarkable. Musculoskeletal: Old healed rib fractures on the RIGHT. Compression fracture deformities of the T3, T7 and T9 vertebral bodies, of uncertain age but most likely chronic based on appearance. Pathologic fracture related to underlying osseous metastases not excluded. IMPRESSION: 1. Solid RIGHT upper lobe mass measuring 7.1 x 6.6 x 6.5 cm, consistent with neoplastic mass, highly suspicious for primary bronchogenic carcinoma. The mass invades the RIGHT suprahilar mediastinum, with associated soft tissue material in the RIGHT lower paratracheal space. 2. 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 3. 7 mm low-density lesion within the left liver lobe, segment 3, too small to definitively characterize, more likely incidental cyst but single  liver metastasis not excluded. 4. Compression fracture deformities of the T3, T7 and T9 vertebral bodies, of uncertain age but most likely chronic based on appearance. Pathologic fracture related to underlying osseous metastases not excluded. 5. Diffuse coronary artery calcifications. Recommend correlation with any possible associated cardiac symptoms. Aortic Atherosclerosis (ICD10-I70.0) and Emphysema (ICD10-J43.9). Electronically Signed   By: Franki Cabot M.D.   On: 04/24/2019 12:30   Dg Chest Port 1 View  Result Date: 04/24/2019 CLINICAL DATA:  85 year old male with a history of shortness of breath and no fever EXAM: PORTABLE CHEST 1 VIEW COMPARISON:  None. FINDINGS: Opacity in the right hilar/suprahilar region extending superiorly. No interlobular septal thickening. Blunting of the left costophrenic angle. No pneumothorax. No significant left-sided airspace disease. No displaced fracture. IMPRESSION: Right hilar and suprahilar opacity. While infection remains on the differential, a mass is suspected given the absence of fever and further evaluation with contrast-enhanced chest CT is  recommended. Electronically Signed   By: Corrie Mckusick D.O.   On: 04/24/2019 10:10    Medications:  Prior to Admission:  Medications Prior to Admission  Medication Sig Dispense Refill Last Dose  . naproxen sodium (ALEVE) 220 MG tablet Take 220 mg by mouth 2 (two) times daily as needed (pain).   04/23/2019 at Unknown time   Scheduled: . azithromycin  250 mg Oral Daily  . cholecalciferol  1,000 Units Oral Daily  . ipratropium-albuterol  3 mL Nebulization Q6H  . methylPREDNISolone (SOLU-MEDROL) injection  40 mg Intravenous Q12H   Continuous:  [id number] **OR** acetaminophen, albuterol  Assesment: He has COPD exacerbation.  He has a large right upper lobe mass likely carcinoma. Principal Problem:   COPD exacerbation ([place]) Active Problems:   Hypoxia   Malignant neoplasm of right lung [facility])    Plan: Since he was brought into the hospital basically for the mass I am going to go ahead and arrange for bronchoscopy today    LOS: 0 days   [name] 04/25/2019, 8:43 AM

## 2019-04-25 NOTE — Plan of Care (Signed)

## 2019-04-25 NOTE — Anesthesia Postprocedure Evaluation (Signed)
Anesthesia Post Note  Patient: Charles Stafford  Procedure(s) Performed: FLEXIBLE BRONCHOSCOPY (Bilateral ) BRONCHIAL WASHINGS (Right ) BRONCHIAL BRUSHINGS (Right )  Patient location during evaluation: PACU Anesthesia Type: MAC and General Level of consciousness: awake and awake and alert Pain management: pain level controlled Vital Signs Assessment: post-procedure vital signs reviewed and stable Respiratory status: spontaneous breathing Cardiovascular status: blood pressure returned to baseline and stable Anesthetic complications: no     Last Vitals:  Vitals:   04/25/19 1011 04/25/19 1047  BP: (!) 122/55 103/88  Pulse: 63 84  Resp: 19 13  Temp: 36.4 C (!) 36.4 C  SpO2: 96% 93%    Last Pain:  Vitals:   04/25/19 1047  TempSrc:   PainSc: 0-No pain                 Talbert Forest Takari Duncombe

## 2019-04-25 NOTE — Op Note (Signed)
Bronchoscopy Procedure Note Charles Stafford [id number] 07/14/1933  Procedure: Bronchoscopy Indications: Diagnostic evaluation of the airways  Procedure Details Consent: Risks of procedure as well as the alternatives and risks of each were explained to the (patient/caregiver).  Consent for procedure obtained. Time Out: Verified patient identification, verified procedure, site/side was marked, verified correct patient position, special equipment/implants available, medications/allergies/relevent history reviewed, required imaging and test results available.  Performed  In preparation for procedure, patient was given 100% FiO2. Sedation: Monitored anesthesia care  Airway entered and the following bronchi were examined: RUL, RML, RLL, LUL, LLL and Bronchi.   Procedures performed: Brushings performed washings done Bronchoscope removed.    Evaluation Hemodynamic Status: BP stable throughout; O2 sats: transiently fell during during procedure Patient's Current Condition: stable Specimens:  Sent serosanguinous fluid Complications: Complications of Hypoxia Patient did tolerate procedure fairly well. It looks like he might have some extrinsic compression of the trachea.  No definite endobronchial lesions seen but he had a lot of secretions.  Brushings and washings were done.  Charles Stafford 04/25/2019

## 2019-04-26 ENCOUNTER — Encounter (HOSPITAL_COMMUNITY): Payer: Self-pay | Admitting: Pulmonary Disease

## 2019-04-26 LAB — BASIC METABOLIC PANEL
Anion gap: 8 (ref 5–15)
BUN: 35 mg/dL — ABNORMAL HIGH (ref 8–23)
CO2: 26 mmol/L (ref 22–32)
Calcium: 8.6 mg/dL — ABNORMAL LOW (ref 8.9–10.3)
Chloride: 102 mmol/L (ref 98–111)
Creatinine, Ser: 0.89 mg/dL (ref 0.61–1.24)
GFR calc Af Amer: >60 mL/min (ref >60)
GFR calc non Af Amer: >60 mL/min (ref >60)
Glucose, Bld: 137 mg/dL — ABNORMAL HIGH (ref 70–99)
Potassium: 5 mmol/L (ref 3.5–5.1)
Sodium: 136 mmol/L (ref 135–145)

## 2019-04-26 LAB — VITAMIN D 25 HYDROXY (VIT D DEFICIENCY, FRACTURES): Vit D, 25-Hydroxy: 27 ng/mL — ABNORMAL LOW (ref 30.0–100.0)

## 2019-04-26 LAB — HEMOGLOBIN A1C
Hgb A1c MFr Bld: 6.5 % — ABNORMAL HIGH (ref 4.8–5.6)
Mean Plasma Glucose: 140 mg/dL

## 2019-04-26 MED ORDER — PREDNISONE 20 MG PO TABS: 50.0000 mg | ORAL_TABLET | Freq: Every day | ORAL | Status: DC

## 2019-04-26 MED ORDER — AZITHROMYCIN 250 MG PO TABS: 250.0000 mg | ORAL_TABLET | Freq: Every day | ORAL | 0 refills | Status: DC

## 2019-04-26 MED ORDER — FOLIC ACID 1 MG PO TABS: 1.0000 mg | ORAL_TABLET | Freq: Every day | ORAL | 0 refills | Status: DC

## 2019-04-26 MED ORDER — FERROUS SULFATE 325 (65 FE) MG PO TABS: 325.0000 mg | ORAL_TABLET | Freq: Every day | ORAL | 3 refills | Status: DC

## 2019-04-26 MED ORDER — PREDNISONE 50 MG PO TABS: 50.0000 mg | ORAL_TABLET | Freq: Every day | ORAL | 0 refills | Status: DC

## 2019-04-26 MED ORDER — FERROUS SULFATE 325 (65 FE) MG PO TABS: 325.0000 mg | ORAL_TABLET | Freq: Every day | ORAL | Status: DC

## 2019-04-26 MED ORDER — ENSURE ENLIVE PO LIQD: 237.0000 mL | Freq: Two times a day (BID) | ORAL | 12 refills | Status: DC

## 2019-04-26 NOTE — Progress Notes (Signed)
Subjective: He says he feels well.  No complaints.  His breathing is doing well.  He is coughing up some sputum.  Objective: Vital signs in last 24 hours: Temp:  [97.5 F (36.4 C)-97.9 F (36.6 C)] 97.9 F (36.6 C) (05/26 0654) Pulse Rate:  [60-93] 73 (05/26 0654) Resp:  [13-21] 16 (05/26 0654) BP: (101-143)/(40-89) 143/89 (05/26 0654) SpO2:  [93 %-98 %] 93 % (05/26 0654) Weight change:     Intake/Output from previous day: 05/25 0701 - 05/26 0700 In: 1915 [P.O.:840; I.V.:1075] Out: 1150 [Urine:1150]  PHYSICAL EXAM General appearance: alert, cooperative and no distress Resp: rhonchi bilaterally Cardio: regular rate and rhythm, S1, S2 normal, no murmur, click, rub or gallop GI: soft, non-tender; bowel sounds normal; no masses,  no organomegaly Extremities: extremities normal, atraumatic, no cyanosis or edema  Lab Results:  Results for orders placed or performed during the hospital encounter of 04/24/19 (from the past 48 hour(s))  Basic metabolic panel     Status: Abnormal   Collection Time: 04/24/19  9:34 AM  Result Value Ref Range   Sodium 135 135 - 145 mmol/L   Potassium 4.3 3.5 - 5.1 mmol/L   Chloride 96 (L) 98 - 111 mmol/L   CO2 26 22 - 32 mmol/L   Glucose, Bld 141 (H) 70 - 99 mg/dL   BUN 27 (H) 8 - 23 mg/dL   Creatinine, Ser 0.92 0.61 - 1.24 mg/dL   Calcium 8.7 (L) 8.9 - 10.3 mg/dL   GFR calc non Af Amer >60 >60 mL/min   GFR calc Af Amer >60 >60 mL/min   Anion gap 13 5 - 15    Comment: Performed at [facility], [address] [place], [place] [address]  Troponin I - Once     Status: None   Collection Time: 04/24/19  9:34 AM  Result Value Ref Range   Troponin I <0.03 <0.03 ng/mL    Comment: Performed at [facility], [address] [place], [place] [address]  CBC with Differential     Status: Abnormal   Collection Time: 04/24/19  9:34 AM  Result Value Ref Range   WBC 11.5 (H) 4.0 - 10.5 K/uL   RBC 4.59 4.22 - 5.81 MIL/uL   Hemoglobin 9.9 (L) 13.0 - 17.0 g/dL    HCT 33.0 (L) 39.0 - 52.0 %   MCV 71.9 (L) 80.0 - 100.0 fL   MCH 21.6 (L) 26.0 - 34.0 pg   MCHC 30.0 30.0 - 36.0 g/dL   RDW 16.9 (H) 11.5 - 15.5 %   Platelets 353 150 - 400 K/uL   nRBC 0.0 0.0 - 0.2 %   Neutrophils Relative % 76 %   Neutro Abs 8.9 (H) 1.7 - 7.7 K/uL   Lymphocytes Relative 9 %   Lymphs Abs 1.0 0.7 - 4.0 K/uL   Monocytes Relative 10 %   Monocytes Absolute 1.1 (H) 0.1 - 1.0 K/uL   Eosinophils Relative 3 %   Eosinophils Absolute 0.3 0.0 - 0.5 K/uL   Basophils Relative 1 %   Basophils Absolute 0.1 0.0 - 0.1 K/uL   Immature Granulocytes 1 %   Abs Immature Granulocytes 0.07 0.00 - 0.07 K/uL    Comment: Performed at [facility], [address], [place], [place] [address]  SARS Coronavirus 2 (CEPHEID- Performed in [place] hospital lab), Hosp Order     Status: None   Collection Time: 04/24/19  9:36 AM  Result Value Ref Range   SARS Coronavirus 2 NEGATIVE NEGATIVE  Comment: (NOTE) If result is NEGATIVE SARS-CoV-2 target nucleic acids are NOT DETECTED. The SARS-CoV-2 RNA is generally detectable in upper and lower  respiratory specimens during the acute phase of infection. The lowest  concentration of SARS-CoV-2 viral copies this assay can detect is 250  copies / mL. A negative result does not preclude SARS-CoV-2 infection  and should not be used as the sole basis for treatment or other  patient management decisions.  A negative result may occur with  improper specimen collection / handling, submission of specimen other  than nasopharyngeal swab, presence of viral mutation(s) within the  areas targeted by this assay, and inadequate number of viral copies  (<250 copies / mL). A negative result must be combined with clinical  observations, patient history, and epidemiological information. If result is POSITIVE SARS-CoV-2 target nucleic acids are DETECTED. The SARS-CoV-2 RNA is generally detectable in upper and lower  respiratory specimens dur ing the acute phase of  infection.  Positive  results are indicative of active infection with SARS-CoV-2.  Clinical  correlation with patient history and other diagnostic information is  necessary to determine patient infection status.  Positive results do  not rule out bacterial infection or co-infection with other viruses. If result is PRESUMPTIVE POSTIVE SARS-CoV-2 nucleic acids MAY BE PRESENT.   A presumptive positive result was obtained on the submitted specimen  and confirmed on repeat testing.  While 2019 novel coronavirus  (SARS-CoV-2) nucleic acids may be present in the submitted sample  additional confirmatory testing may be necessary for epidemiological  and / or clinical management purposes  to differentiate between  SARS-CoV-2 and other Sarbecovirus currently known to infect humans.  If clinically indicated additional testing with an alternate test  methodology [phone]) is advised. The SARS-CoV-2 RNA is generally  detectable in upper and lower respiratory sp ecimens during the acute  phase of infection. The expected result is Negative. Fact Sheet for Patients:  StrictlyIdeas.no Fact Sheet for Healthcare Providers: BankingDealers.co.za This test is not yet approved or cleared by the [place] FDA and has been authorized for detection and/or diagnosis of SARS-CoV-2 by FDA under an Emergency Use Authorization (EUA).  This EUA will remain in effect (meaning this test can be used) for the duration of the COVID-19 declaration under Section 564(b)(1) of the Act, 21 U.S.C. section 360bbb-3(b)(1), unless the authorization is terminated or revoked sooner. Performed at [facility], [address] [place], [place] [address]   Comprehensive metabolic panel     Status: Abnormal   Collection Time: 04/25/19  4:20 AM  Result Value Ref Range   Sodium 131 (L) 135 - 145 mmol/L   Potassium 4.6 3.5 - 5.1 mmol/L   Chloride 95 (L) 98 - 111 mmol/L   CO2 26 22 - 32  mmol/L   Glucose, Bld 162 (H) 70 - 99 mg/dL   BUN 37 (H) 8 - 23 mg/dL   Creatinine, Ser 1.07 0.61 - 1.24 mg/dL   Calcium 8.7 (L) 8.9 - 10.3 mg/dL   Total Protein 6.6 6.5 - 8.1 g/dL   Albumin 3.0 (L) 3.5 - 5.0 g/dL   AST 15 15 - 41 U/L   ALT 12 0 - 44 U/L   Alkaline Phosphatase 121 38 - 126 U/L   Total Bilirubin 0.4 0.3 - 1.2 mg/dL   GFR calc non Af Amer >60 >60 mL/min   GFR calc Af Amer >60 >60 mL/min   Anion gap 10 5 - 15    Comment: Performed at  [place] [place], [place] [address]  CBC     Status: Abnormal   Collection Time: 04/25/19  4:20 AM  Result Value Ref Range   WBC 14.2 (H) 4.0 - 10.5 K/uL   RBC 3.99 (L) 4.22 - 5.81 MIL/uL   Hemoglobin 8.4 (L) 13.0 - 17.0 g/dL    Comment: Reticulocyte Hemoglobin testing may be clinically indicated, consider ordering this additional test [id number]    HCT 28.7 (L) 39.0 - 52.0 %   MCV 71.9 (L) 80.0 - 100.0 fL   MCH 21.1 (L) 26.0 - 34.0 pg   MCHC 29.3 (L) 30.0 - 36.0 g/dL   RDW 16.4 (H) 11.5 - 15.5 %   Platelets 346 150 - 400 K/uL   nRBC 0.0 0.0 - 0.2 %    Comment: Performed at [facility], [address] [place], [place] [address]  Hemoglobin A1c     Status: Abnormal   Collection Time: 04/25/19  4:20 AM  Result Value Ref Range   Hgb A1c MFr Bld 6.5 (H) 4.8 - 5.6 %    Comment: (NOTE)         Prediabetes: 5.7 - 6.4         Diabetes: >6.4         Glycemic control for adults with diabetes: <7.0    Mean Plasma Glucose 140 mg/dL    Comment: (NOTE) Performed At: [facility] [place], [place] [id number] Rush Farmer MD IW:[id number]   Lipid panel     Status: None   Collection Time: 04/25/19  4:20 AM  Result Value Ref Range   Cholesterol 148 0 - 200 mg/dL   Triglycerides 40 <150 mg/dL   HDL 49 >40 mg/dL   Total CHOL/HDL Ratio 3.0 RATIO   VLDL 8 0 - 40 mg/dL   LDL Cholesterol 91 0 - 99 mg/dL    Comment:        Total Cholesterol/HDL:CHD Risk Coronary Heart Disease Risk Table                      Men   Women  1/2 Average Risk   3.4   3.3  Average Risk       5.0   4.4  2 X Average Risk   9.6   7.1  3 X Average Risk  23.4   11.0        Use the calculated Patient Ratio above and the CHD Risk Table to determine the patient's CHD Risk.        ATP III CLASSIFICATION (LDL):  <100     mg/dL   Optimal  100-129  mg/dL   Near or Above                    Optimal  130-159  mg/dL   Borderline  160-189  mg/dL   High  >190     mg/dL   Very High Performed at [facility], [address], [place], [place] [address]   Iron and TIBC     Status: Abnormal   Collection Time: 04/25/19  4:21 AM  Result Value Ref Range   Iron 9 (L) 45 - 182 ug/dL   TIBC 371 250 - 450 ug/dL   Saturation Ratios 2 (L) 17.9 - 39.5 %   UIBC 362 ug/dL    Comment: Performed at [facility], [address], [place], [place] [address]  Vitamin B12     Status: None  Collection Time: 04/25/19  4:21 AM  Result Value Ref Range   Vitamin B-12 628 180 - 914 pg/mL    Comment: (NOTE) This assay is not validated for testing neonatal or myeloproliferative syndrome specimens for Vitamin B12 levels. Performed at [facility], [address], [place], [place] [address]   Folate     Status: Abnormal   Collection Time: 04/25/19  4:21 AM  Result Value Ref Range   Folate 4.9 (L) >5.9 ng/mL    Comment: Performed at [facility], [address] [place], [place] [address]  Ferritin     Status: Abnormal   Collection Time: 04/25/19  4:21 AM  Result Value Ref Range   Ferritin 8 (L) 24 - 336 ng/mL    Comment: Performed at [facility], [address] [place], [place] [address]  Basic metabolic panel     Status: Abnormal   Collection Time: 04/26/19  7:42 AM  Result Value Ref Range   Sodium 136 135 - 145 mmol/L   Potassium 5.0 3.5 - 5.1 mmol/L   Chloride 102 98 - 111 mmol/L   CO2 26 22 - 32 mmol/L   Glucose, Bld 137 (H) 70 - 99 mg/dL   BUN 35 (H) 8 - 23 mg/dL   Creatinine, Ser 0.89 0.61 - 1.24 mg/dL   Calcium 8.6 (L) 8.9  - 10.3 mg/dL   GFR calc non Af Amer >60 >60 mL/min   GFR calc Af Amer >60 >60 mL/min   Anion gap 8 5 - 15    Comment: Performed at [facility], [address], [place], [place] [address]    ABGS No results for input(s): PHART, PO2ART, TCO2, HCO3 in the last 72 hours.  Invalid input(s): PCO2 CULTURES Recent Results (from the past 240 hour(s))  SARS Coronavirus 2 (CEPHEID- Performed in [place] hospital lab), Hosp Order     Status: None   Collection Time: 04/24/19  9:36 AM  Result Value Ref Range Status   SARS Coronavirus 2 NEGATIVE NEGATIVE Final    Comment: (NOTE) If result is NEGATIVE SARS-CoV-2 target nucleic acids are NOT DETECTED. The SARS-CoV-2 RNA is generally detectable in upper and lower  respiratory specimens during the acute phase of infection. The lowest  concentration of SARS-CoV-2 viral copies this assay can detect is 250  copies / mL. A negative result does not preclude SARS-CoV-2 infection  and should not be used as the sole basis for treatment or other  patient management decisions.  A negative result may occur with  improper specimen collection / handling, submission of specimen other  than nasopharyngeal swab, presence of viral mutation(s) within the  areas targeted by this assay, and inadequate number of viral copies  (<250 copies / mL). A negative result must be combined with clinical  observations, patient history, and epidemiological information. If result is POSITIVE SARS-CoV-2 target nucleic acids are DETECTED. The SARS-CoV-2 RNA is generally detectable in upper and lower  respiratory specimens dur ing the acute phase of infection.  Positive  results are indicative of active infection with SARS-CoV-2.  Clinical  correlation with patient history and other diagnostic information is  necessary to determine patient infection status.  Positive results do  not rule out bacterial infection or co-infection with other viruses. If result is PRESUMPTIVE  POSTIVE SARS-CoV-2 nucleic acids MAY BE PRESENT.   A presumptive positive result was obtained on the submitted specimen  and confirmed on repeat testing.  While 2019 novel coronavirus  (SARS-CoV-2) nucleic acids may be  present in the submitted sample  additional confirmatory testing may be necessary for epidemiological  and / or clinical management purposes  to differentiate between  SARS-CoV-2 and other Sarbecovirus currently known to infect humans.  If clinically indicated additional testing with an alternate test  methodology [phone]) is advised. The SARS-CoV-2 RNA is generally  detectable in upper and lower respiratory sp ecimens during the acute  phase of infection. The expected result is Negative. Fact Sheet for Patients:  StrictlyIdeas.no Fact Sheet for Healthcare Providers: BankingDealers.co.za This test is not yet approved or cleared by the [place] FDA and has been authorized for detection and/or diagnosis of SARS-CoV-2 by FDA under an Emergency Use Authorization (EUA).  This EUA will remain in effect (meaning this test can be used) for the duration of the COVID-19 declaration under Section 564(b)(1) of the Act, 21 U.S.C. section 360bbb-3(b)(1), unless the authorization is terminated or revoked sooner. Performed at [facility], [address] [place], [place] [address]    Studies/Results: Ct Chest W Contrast  Result Date: 04/24/2019 CLINICAL DATA:  Shortness of breath, worsening last night. EXAM: CT CHEST WITH CONTRAST TECHNIQUE: Multidetector CT imaging of the chest was performed during intravenous contrast administration. CONTRAST:  82mL OMNIPAQUE IOHEXOL 300 MG/ML  SOLN COMPARISON:  Chest x-ray from earlier same day. FINDINGS: Cardiovascular: Aortic atherosclerosis. No thoracic aortic aneurysm or evidence of aortic dissection. 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 Heart size is within normal limits. No pericardial effusion. Diffuse coronary artery calcifications. Mediastinum/Nodes: Esophagus is unremarkable. Trachea and central bronchi are unremarkable. Additional description below. Lungs/Pleura: Solid RIGHT upper lobe mass measures 7.1 x 6.6 x 6.5 cm (AP by transverse by craniocaudal dimensions). The RIGHT upper lobe mass appears to invade the RIGHT suprahilar mediastinum, with associated soft tissue material in the RIGHT lower paratracheal space (series 2, image 60). Low-density mass in the upper RIGHT hilum measures 2 cm short axis dimension, likely metastatic lymph node. No enlarged lymph nodes seen in the LEFT hilum or elsewhere within the mediastinum. Central lobular emphysematous changes bilaterally, moderate in degree, upper lobe predominant. No additional nodule or mass appreciated within either lung. Upper Abdomen: 7 mm low-density lesion within the LEFT liver lobe, segment 3 (series 2, image 156), too small to definitively characterize. No other liver lesion identified, although the liver is incompletely imaged. Limited images of the upper abdomen are otherwise unremarkable. Musculoskeletal: Old healed rib fractures on the RIGHT. Compression fracture deformities of the T3, T7 and T9 vertebral bodies, of uncertain age but most likely chronic based on appearance. Pathologic fracture related to underlying osseous metastases not excluded. IMPRESSION: 1. Solid RIGHT upper lobe mass measuring 7.1 x 6.6 x 6.5 cm, consistent with neoplastic mass, highly suspicious for primary bronchogenic carcinoma. The mass invades the RIGHT suprahilar mediastinum, with associated soft tissue material in the RIGHT lower paratracheal space. 2. 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 3. 7 mm low-density lesion within the left liver lobe, segment 3, too small to definitively characterize, more likely  incidental cyst but single liver metastasis not excluded. 4. Compression fracture deformities of the T3, T7 and T9 vertebral bodies, of uncertain age but most likely chronic based on appearance. Pathologic fracture related to underlying osseous metastases not excluded. 5. Diffuse coronary artery calcifications. Recommend correlation with any possible associated cardiac symptoms. Aortic Atherosclerosis (ICD10-I70.0) and Emphysema (ICD10-J43.9). Electronically Signed   By: Franki Cabot  M.D.   On: 04/24/2019 12:30   Dg Chest Port 1 View  Result Date: 04/24/2019 CLINICAL DATA:  85 year old male with a history of shortness of breath and no fever EXAM: PORTABLE CHEST 1 VIEW COMPARISON:  None. FINDINGS: Opacity in the right hilar/suprahilar region extending superiorly. No interlobular septal thickening. Blunting of the left costophrenic angle. No pneumothorax. No significant left-sided airspace disease. No displaced fracture. IMPRESSION: Right hilar and suprahilar opacity. While infection remains on the differential, a mass is suspected given the absence of fever and further evaluation with contrast-enhanced chest CT is recommended. Electronically Signed   By: Corrie Mckusick D.O.   On: 04/24/2019 10:10    Medications:  Prior to Admission:  Medications Prior to Admission  Medication Sig Dispense Refill Last Dose  . naproxen sodium (ALEVE) 220 MG tablet Take 220 mg by mouth 2 (two) times daily as needed (pain).   04/23/2019 at Unknown time   Scheduled: . azithromycin  250 mg Oral Daily  . cholecalciferol  1,000 Units Oral Daily  . feeding supplement (ENSURE ENLIVE)  237 mL Oral BID BM  . folic acid  1 mg Oral Daily  . ipratropium-albuterol  3 mL Nebulization Q6H  . methylPREDNISolone (SOLU-MEDROL) injection  40 mg Intravenous Q12H  . multivitamin with minerals  1 tablet Oral Daily   Continuous: . sodium chloride 75 mL/hr at 04/26/19 0254   VEL:[id number] **OR** acetaminophen,  albuterol  Assesment: He was admitted with COPD exacerbation and acute hypoxic respiratory failure.  He has what is presumed to be a malignancy in his right lung.  He had bronchoscopy done yesterday but results will not be out until tomorrow likely.  He says he feels better and wants to go home. Principal Problem:   COPD exacerbation ([place]) Active Problems:   Hypoxia   Malignant neoplasm of right lung ([place])   Acute respiratory failure with hypoxia ([place])    Plan: From my point of view as far as the bronchoscopy is concerned he could be discharged home if he is okay otherwise    LOS: 1 day   Charles Stafford 04/26/2019, 8:43 AM

## 2019-04-26 NOTE — Plan of Care (Signed)
  Problem: Education: Goal: Knowledge of General Education information will improve Description Including pain rating scale, medication(s)/side effects and non-pharmacologic comfort measures 04/26/2019 1400 by Rance Muir, RN Outcome: Adequate for Discharge 04/26/2019 1036 by Rance Muir, RN Outcome: Progressing   Problem: Health Behavior/Discharge Planning: Goal: Ability to manage health-related needs will improve 04/26/2019 1400 by Rance Muir, RN Outcome: Adequate for Discharge 04/26/2019 1036 by Rance Muir, RN Outcome: Progressing   Problem: Clinical Measurements: Goal: Ability to maintain clinical measurements within normal limits will improve 04/26/2019 1400 by Rance Muir, RN Outcome: Adequate for Discharge 04/26/2019 1036 by Rance Muir, RN Outcome: Progressing Goal: Will remain free from infection 04/26/2019 1400 by Rance Muir, RN Outcome: Adequate for Discharge 04/26/2019 1036 by Rance Muir, RN Outcome: Progressing Goal: Diagnostic test results will improve 04/26/2019 1400 by Rance Muir, RN Outcome: Adequate for Discharge 04/26/2019 1036 by Rance Muir, RN Outcome: Progressing Goal: Respiratory complications will improve 04/26/2019 1400 by Rance Muir, RN Outcome: Adequate for Discharge 04/26/2019 1036 by Rance Muir, RN Outcome: Progressing Goal: Cardiovascular complication will be avoided 04/26/2019 1400 by Rance Muir, RN Outcome: Adequate for Discharge 04/26/2019 1036 by Rance Muir, RN Outcome: Progressing   Problem: Activity: Goal: Risk for activity intolerance will decrease 04/26/2019 1400 by Rance Muir, RN Outcome: Adequate for Discharge 04/26/2019 1036 by Rance Muir, RN Outcome: Progressing   Problem: Nutrition: Goal: Adequate nutrition will be maintained 04/26/2019 1400 by Rance Muir, RN Outcome: Adequate for Discharge 04/26/2019 1036 by Rance Muir, RN Outcome: Progressing   Problem: Coping: Goal: Level of anxiety will decrease 04/26/2019 1400 by Rance Muir, RN Outcome: Adequate for  Discharge 04/26/2019 1036 by Rance Muir, RN Outcome: Progressing   Problem: Elimination: Goal: Will not experience complications related to bowel motility 04/26/2019 1400 by Rance Muir, RN Outcome: Adequate for Discharge 04/26/2019 1036 by Rance Muir, RN Outcome: Progressing Goal: Will not experience complications related to urinary retention 04/26/2019 1400 by Rance Muir, RN Outcome: Adequate for Discharge 04/26/2019 1036 by Rance Muir, RN Outcome: Progressing   Problem: Pain Managment: Goal: General experience of comfort will improve 04/26/2019 1400 by Rance Muir, RN Outcome: Adequate for Discharge 04/26/2019 1036 by Rance Muir, RN Outcome: Progressing   Problem: Safety: Goal: Ability to remain free from injury will improve 04/26/2019 1400 by Rance Muir, RN Outcome: Adequate for Discharge 04/26/2019 1036 by Rance Muir, RN Outcome: Progressing   Problem: Skin Integrity: Goal: Risk for impaired skin integrity will decrease 04/26/2019 1400 by Rance Muir, RN Outcome: Adequate for Discharge 04/26/2019 1036 by Rance Muir, RN Outcome: Progressing

## 2019-04-26 NOTE — TOC Transition Note (Signed)
Transition of Care [facility]) - CM/SW Discharge Note   Patient Details  Name: Canon Gola MRN: [id number] Date of Birth: 1933/06/11  [facility] CM/SW Contact:  Sherald Barge, RN Phone Number: 04/26/2019, 1:23 PM   Clinical Narrative:   Home O2 and cane through [place] will be delivered to hospital prior to DC. Referral to OP rehab made to [place]. F/u established with Dr. Raliegh Ip and Dr. Frances Furbish. DC plans discussed with pt at bedside and daughter via phone. Pt going to live at daughters home address [address], [place] [address].     Final next level of care: Home/Self Care     Patient Goals and CMS Choice Patient states their goals for this hospitalization and ongoing recovery are:: go home CMS Medicare.gov Compare Post Acute Care list provided to:: Patient(CMS website not working - provider options discussed) Choice offered to / list presented to : Patient  Discharge Plan and Services     Post Acute Care Choice: Durable Medical Equipment          DME Arranged: Kasandra Knudsen, Oxygen DME Agency: [employer] Date DME Agency Contacted: 04/26/19 Time DME Agency Contacted: 74 Representative spoke with at DME Agency: kathy cheek      Readmission Risk Interventions Readmission Risk Prevention Plan 04/25/2019  Medication Screening Complete  Transportation Screening Complete

## 2019-04-26 NOTE — Plan of Care (Signed)

## 2019-04-26 NOTE — Discharge Summary (Signed)
Physician Discharge Summary  Carolos Fecher UDJ:[id number] DOB: 05/08/1933 DOA: 04/24/2019  PCP: Patient, No Pcp Per  Admit date: 04/24/2019 Discharge date: 04/26/2019  Admitted From: Home Disposition:  Home   Recommendations for Outpatient Follow-up:  1. Follow up with PCP in 1-2 weeks 2. Please obtain BMP/CBC in one week 3. Follow up heme/onc--Katragadda on 05/04/19  Home Health: YES Equipment/Devices: 2L  Discharge Condition: Stable CODE STATUS: FULL Diet recommendation: Heart Healthy   Brief/Interim Summary: 85 year old male with a history of tobacco abuse and presumptive COPD presenting with 3-day history of worsening shortness of breath.  The patient has chronic shortness of breath at baseline, but this has worsened for 3 days.  He has had a nonproductive cough.  There is no fevers, chills, chest pain, nausea, vomiting, diarrhea.  He is in [place] visiting from [place], but according to the patient he is planning to stay here permanently with his family.  Upon presentation, the patient was afebrile hemodynamically stable.  BMP and LFTs were essentially unremarkable.  Hemoglobin was noted to be 9.9.  CT of the chest showed a right upper lobe mass measuring 7.1 x 6.6 x 6.5 cm concerning for neoplasm.  There was invasion of the right suprahilar and right lower paratracheal space.  There was T3, T7, T9 compression deformities likely chronic.  The patient was started on IV Solu-Medrol.  Pulmonary was consulted to assist with management.  The patient underwent bronchoscopy on 04/25/2019.  Discharge Diagnoses:   Acute respiratory failure with hypoxia -Secondary COPD exacerbation -CT chest as discussed above -Continue duo nebs -Continue IV Solu-Medrol>>>d/c home with prednisone 50 mg daily x 4 more days to complete 1 week tx -Continue azithromycin--2 more days after d/c to complete 5 days -ambulatory pulse--desaturated <88%-->send home with 2 L [place]  COPD exacerbation -As  discussed above  Right upper lobe lung mass -Appreciate pulmonary consult -04/25/2019--bronchoscopy--await pathology--pending at time of d/c -f/u med onc 05/04/19 at 130PM  Tobacco abuse -he continues to smoke I have discussed tobacco cessation with the patient.  I have counseled the patient regarding the negative impacts of continued tobacco use including but not limited to lung cancer, COPD, and cardiovascular disease.  I have discussed alternatives to tobacco and modalities that may help facilitate tobacco cessation including but not limited to biofeedback, hypnosis, and medications.  Total time spent with tobacco counseling was 4 minutes.  Iron deficiency anemia -Iron saturation 2%, ferritin 8 -Folic acid 4.9 -Feraheme x1 on 5/88 -Start folic acid -d/c home with po iron  T3, T7, T9 Compression Fractures:  -appear chronic. Possibly related to malignancy above. -Follow vitamin D level--pending -Supplementation  Diffuse Coronary Artery Calcifications on imaging: -pt without c/o CP. Continue to follow outpatient.  -Follow lipids--LDL 91 -A1c--pending  Discharge Instructions   Allergies as of 04/26/2019   No Known Allergies     Medication List    TAKE these medications   azithromycin 250 MG tablet Commonly known as:  ZITHROMAX Take 1 tablet (250 mg total) by mouth daily. Start taking on:  Apr 27, 2019   feeding supplement (ENSURE ENLIVE) Liqd Take 237 mLs by mouth 2 (two) times daily between meals.   ferrous sulfate 325 (65 FE) MG tablet Take 1 tablet (325 mg total) by mouth daily with breakfast. Start taking on:  Apr 26, 5026   folic acid 1 MG tablet Commonly known as:  FOLVITE Take 1 tablet (1 mg total) by mouth daily. Start taking on:  Apr 27, 2019   naproxen  sodium 220 MG tablet Commonly known as:  ALEVE Take 220 mg by mouth 2 (two) times daily as needed (pain).   predniSONE 50 MG tablet Commonly known as:  DELTASONE Take 1 tablet (50 mg total) by  mouth daily with breakfast. Start taking on:  Apr 27, 2019            Durable Medical Equipment  (From admission, onward)         Start     Ordered   04/26/19 1157  For home use only DME oxygen  Once    Question Answer Comment  Length of Need Lifetime   Mode or (Route) Nasal cannula   Liters per Minute 2   Frequency Continuous (stationary and portable oxygen unit needed)   Oxygen delivery system Gas      04/26/19 1156   04/26/19 1055  For home use only DME oxygen  Once    Comments:  Deliver portable oxygen device  Question Answer Comment  Length of Need Lifetime   Mode or (Route) Nasal cannula   Liters per Minute 2   Frequency Continuous (stationary and portable oxygen unit needed)   Oxygen conserving device Yes   Oxygen delivery system Gas      04/26/19 1055         Follow-up Information    Derek Jack, MD On 05/04/2019.   Specialty:  Hematology Why:  at 1:30 pm Contact information: [address] [place] [address] [phone]          No Known Allergies  Consultations:  pulm   Procedures/Studies: Ct Chest W Contrast  Result Date: 04/24/2019 CLINICAL DATA:  Shortness of breath, worsening last night. EXAM: CT CHEST WITH CONTRAST TECHNIQUE: Multidetector CT imaging of the chest was performed during intravenous contrast administration. CONTRAST:  70mL OMNIPAQUE IOHEXOL 300 MG/ML  SOLN COMPARISON:  Chest x-ray from earlier same day. FINDINGS: Cardiovascular: Aortic atherosclerosis. No thoracic aortic aneurysm or evidence of aortic dissection. 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 Heart size is within normal limits. No pericardial effusion. Diffuse coronary artery calcifications. Mediastinum/Nodes: Esophagus is unremarkable. Trachea and central bronchi are unremarkable. Additional description below. Lungs/Pleura: Solid RIGHT upper lobe mass measures 7.1 x 6.6 x 6.5 cm (AP by transverse by  craniocaudal dimensions). The RIGHT upper lobe mass appears to invade the RIGHT suprahilar mediastinum, with associated soft tissue material in the RIGHT lower paratracheal space (series 2, image 60). Low-density mass in the upper RIGHT hilum measures 2 cm short axis dimension, likely metastatic lymph node. No enlarged lymph nodes seen in the LEFT hilum or elsewhere within the mediastinum. Central lobular emphysematous changes bilaterally, moderate in degree, upper lobe predominant. No additional nodule or mass appreciated within either lung. Upper Abdomen: 7 mm low-density lesion within the LEFT liver lobe, segment 3 (series 2, image 156), too small to definitively characterize. No other liver lesion identified, although the liver is incompletely imaged. Limited images of the upper abdomen are otherwise unremarkable. Musculoskeletal: Old healed rib fractures on the RIGHT. Compression fracture deformities of the T3, T7 and T9 vertebral bodies, of uncertain age but most likely chronic based on appearance. Pathologic fracture related to underlying osseous metastases not excluded. IMPRESSION: 1. Solid RIGHT upper lobe mass measuring 7.1 x 6.6 x 6.5 cm, consistent with neoplastic mass, highly suspicious for primary bronchogenic carcinoma. The mass invades the RIGHT suprahilar mediastinum, with associated soft tissue material in the RIGHT lower paratracheal space. 2. Low-density mass in  the upper right hilum, measuring 2 cm short axis dimension, most likely lymph node metastasis. 3. 7 mm low-density lesion within the left liver lobe, segment 3, too small to definitively characterize, more likely incidental cyst but single liver metastasis not excluded. 4. Compression fracture deformities of the T3, T7 and T9 vertebral bodies, of uncertain age but most likely chronic based on appearance. Pathologic fracture related to underlying osseous metastases not excluded. 5. Diffuse coronary artery calcifications. Recommend  correlation with any possible associated cardiac symptoms. Aortic Atherosclerosis (ICD10-I70.0) and Emphysema (ICD10-J43.9). Electronically Signed   By: Franki Cabot M.D.   On: 04/24/2019 12:30   Dg Chest Port 1 View  Result Date: 04/24/2019 CLINICAL DATA:  85 year old male with a history of shortness of breath and no fever EXAM: PORTABLE CHEST 1 VIEW COMPARISON:  None. FINDINGS: Opacity in the right hilar/suprahilar region extending superiorly. No interlobular septal thickening. Blunting of the left costophrenic angle. No pneumothorax. No significant left-sided airspace disease. No displaced fracture. IMPRESSION: Right hilar and suprahilar opacity. While infection remains on the differential, a mass is suspected given the absence of fever and further evaluation with contrast-enhanced chest CT is recommended. Electronically Signed   By: Corrie Mckusick D.O.   On: 04/24/2019 10:10        Discharge Exam: Vitals:   04/26/19 0654 04/26/19 0857  BP: (!) 143/89   Pulse: 73   Resp: 16   Temp: 97.9 F (36.6 C)   SpO2: 93% (!) 89%   Vitals:   04/26/19 0016 04/26/19 0134 04/26/19 0654 04/26/19 0857  BP: (!) 109/47  (!) 143/89   Pulse: 76  73   Resp: 17  16   Temp: 97.9 F (36.6 C)  97.9 F (36.6 C)   TempSrc: Oral  Oral   SpO2: 95% 93% 93% (!) 89%  Weight:      Height:        General: Pt is alert, awake, not in acute distress Cardiovascular: RRR, S1/S2 +, no rubs, no gallops Respiratory:diminished breath sounds. No wheeze Abdominal: Soft, NT, ND, bowel sounds + Extremities: no edema, no cyanosis   The results of significant diagnostics from this hospitalization (including imaging, microbiology, ancillary and laboratory) are listed below for reference.    Significant Diagnostic Studies: Ct Chest W Contrast  Result Date: 04/24/2019 CLINICAL DATA:  Shortness of breath, worsening last night. EXAM: CT CHEST WITH CONTRAST TECHNIQUE: Multidetector CT imaging of the chest was performed  during intravenous contrast administration. CONTRAST:  90mL OMNIPAQUE IOHEXOL 300 MG/ML  SOLN COMPARISON:  Chest x-ray from earlier same day. FINDINGS: Cardiovascular: Aortic atherosclerosis. No thoracic aortic aneurysm or evidence of aortic dissection. 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 Heart size is within normal limits. No pericardial effusion. Diffuse coronary artery calcifications. Mediastinum/Nodes: Esophagus is unremarkable. Trachea and central bronchi are unremarkable. Additional description below. Lungs/Pleura: Solid RIGHT upper lobe mass measures 7.1 x 6.6 x 6.5 cm (AP by transverse by craniocaudal dimensions). The RIGHT upper lobe mass appears to invade the RIGHT suprahilar mediastinum, with associated soft tissue material in the RIGHT lower paratracheal space (series 2, image 60). Low-density mass in the upper RIGHT hilum measures 2 cm short axis dimension, likely metastatic lymph node. No enlarged lymph nodes seen in the LEFT hilum or elsewhere within the mediastinum. Central lobular emphysematous changes bilaterally, moderate in degree, upper lobe predominant. No additional nodule or mass appreciated within either lung. Upper Abdomen: 7 mm low-density lesion within the LEFT liver  lobe, segment 3 (series 2, image 156), too small to definitively characterize. No other liver lesion identified, although the liver is incompletely imaged. Limited images of the upper abdomen are otherwise unremarkable. Musculoskeletal: Old healed rib fractures on the RIGHT. Compression fracture deformities of the T3, T7 and T9 vertebral bodies, of uncertain age but most likely chronic based on appearance. Pathologic fracture related to underlying osseous metastases not excluded. IMPRESSION: 1. Solid RIGHT upper lobe mass measuring 7.1 x 6.6 x 6.5 cm, consistent with neoplastic mass, highly suspicious for primary bronchogenic carcinoma. The mass invades the RIGHT  suprahilar mediastinum, with associated soft tissue material in the RIGHT lower paratracheal space. 2. 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 3. 7 mm low-density lesion within the left liver lobe, segment 3, too small to definitively characterize, more likely incidental cyst but single liver metastasis not excluded. 4. Compression fracture deformities of the T3, T7 and T9 vertebral bodies, of uncertain age but most likely chronic based on appearance. Pathologic fracture related to underlying osseous metastases not excluded. 5. Diffuse coronary artery calcifications. Recommend correlation with any possible associated cardiac symptoms. Aortic Atherosclerosis (ICD10-I70.0) and Emphysema (ICD10-J43.9). Electronically Signed   By: Franki Cabot M.D.   On: 04/24/2019 12:30   Dg Chest Port 1 View  Result Date: 04/24/2019 CLINICAL DATA:  85 year old male with a history of shortness of breath and no fever EXAM: PORTABLE CHEST 1 VIEW COMPARISON:  None. FINDINGS: Opacity in the right hilar/suprahilar region extending superiorly. No interlobular septal thickening. Blunting of the left costophrenic angle. No pneumothorax. No significant left-sided airspace disease. No displaced fracture. IMPRESSION: Right hilar and suprahilar opacity. While infection remains on the differential, a mass is suspected given the absence of fever and further evaluation with contrast-enhanced chest CT is recommended. Electronically Signed   By: Corrie Mckusick D.O.   On: 04/24/2019 10:10     Microbiology: Recent Results (from the past 240 hour(s))  SARS Coronavirus 2 (CEPHEID- Performed in [facility] hospital lab), Hosp Order     Status: None   Collection Time: 04/24/19  9:36 AM  Result Value Ref Range Status   SARS Coronavirus 2 NEGATIVE NEGATIVE Final    Comment: (NOTE) If result is NEGATIVE SARS-CoV-2 target nucleic acids are NOT DETECTED. The SARS-CoV-2 RNA  is generally detectable in upper and lower  respiratory specimens during the acute phase of infection. The lowest  concentration of SARS-CoV-2 viral copies this assay can detect is 250  copies / mL. A negative result does not preclude SARS-CoV-2 infection  and should not be used as the sole basis for treatment or other  patient management decisions.  A negative result may occur with  improper specimen collection / handling, submission of specimen other  than nasopharyngeal swab, presence of viral mutation(s) within the  areas targeted by this assay, and inadequate number of viral copies  (<250 copies / mL). A negative result must be combined with clinical  observations, patient history, and epidemiological information. If result is POSITIVE SARS-CoV-2 target nucleic acids are DETECTED. The SARS-CoV-2 RNA is generally detectable in upper and lower  respiratory specimens dur ing the acute phase of infection.  Positive  results are indicative of active infection with SARS-CoV-2.  Clinical  correlation with patient history and other diagnostic information is  necessary to determine patient infection status.  Positive results do  not rule out bacterial infection or co-infection with other viruses. If result is  PRESUMPTIVE POSTIVE SARS-CoV-2 nucleic acids MAY BE PRESENT.   A presumptive positive result was obtained on the submitted specimen  and confirmed on repeat testing.  While 2019 novel coronavirus  (SARS-CoV-2) nucleic acids may be present in the submitted sample  additional confirmatory testing may be necessary for epidemiological  and / or clinical management purposes  to differentiate between  SARS-CoV-2 and other Sarbecovirus currently known to infect humans.  If clinically indicated additional testing with an alternate test  methodology [phone]) is advised. The SARS-CoV-2 RNA is generally  detectable in upper and lower respiratory sp ecimens during the acute  phase of  infection. The expected result is Negative. Fact Sheet for Patients:  StrictlyIdeas.no Fact Sheet for Healthcare Providers: BankingDealers.co.za This test is not yet approved or cleared by the [place] FDA and has been authorized for detection and/or diagnosis of SARS-CoV-2 by FDA under an Emergency Use Authorization (EUA).  This EUA will remain in effect (meaning this test can be used) for the duration of the COVID-19 declaration under Section 564(b)(1) of the Act, 21 U.S.C. section 360bbb-3(b)(1), unless the authorization is terminated or revoked sooner. Performed at [facility], [address] [place], [place] [address]      Labs: Basic Metabolic Panel: Recent Labs  Lab 04/24/19 0934 04/25/19 0420 04/26/19 0742  NA 135 131* 136  K 4.3 4.6 5.0  CL 96* 95* 102  CO2 26 26 26   GLUCOSE 141* 162* 137*  BUN 27* 37* 35*  CREATININE 0.92 1.07 0.89  CALCIUM 8.7* 8.7* 8.6*   Liver Function Tests: Recent Labs  Lab 04/25/19 0420  AST 15  ALT 12  ALKPHOS 121  BILITOT 0.4  PROT 6.6  ALBUMIN 3.0*   No results for input(s): LIPASE, AMYLASE in the last 168 hours. No results for input(s): AMMONIA in the last 168 hours. CBC: Recent Labs  Lab 04/24/19 0934 04/25/19 0420  WBC 11.5* 14.2*  NEUTROABS 8.9*  --   HGB 9.9* 8.4*  HCT 33.0* 28.7*  MCV 71.9* 71.9*  PLT 353 346   Cardiac Enzymes: Recent Labs  Lab 04/24/19 0934  TROPONINI <0.03   BNP: Invalid input(s): POCBNP CBG: No results for input(s): GLUCAP in the last 168 hours.  Time coordinating discharge:  36 minutes  Signed:  Orson Eva, DO [facility] Pager: [phone] 04/26/2019, 12:25 PM

## 2019-04-26 NOTE — Discharge Instructions (Signed)
Sndrome de dificultad respiratoria aguda en los adultos Acute Respiratory Distress Syndrome, Adult  El sndrome de dificultad respiratoria aguda es una afeccin potencialmente mortal en la que se acumula lquido en los pulmones. Esto evita que los pulmones se llenen con aire y pasen oxgeno a Herbalist. Tambin puede [employer] pulmones y otros rganos vitales funcionen mal. Por lo general, la afeccin aparece despus de una infeccin, enfermedad, ciruga o lesin. Cules son las causas? Esta afeccin puede ser causada por lo siguiente:  Una infeccin, como sepsis o neumona.  Una lesin grave en la cabeza o en el pecho.  Sangrado intenso de una herida.  Clementeen Hoof mayor.  La inhalacin de humo o sustancias qumicas perjudiciales.  Transfusiones de [place].  Un cogulo de [employer].  Inhalar vmito (aspiracin).  Cuasiahogamiento.  Inflamacin del pncreas (pancreatitis).  Una sobredosis de drogas. Cules son los signos o los sntomas? Los sntomas principales de esta afeccin son falta repentina de aire y respiracin acelerada. Otros sntomas pueden incluir lo siguiente:  Latidos cardacos rpidos o irregulares.  Piel, labios o yemas de los dedos de color azulado (cianosis).  Confusin.  Cansancio o prdida de Sales promotion account executive.  Dolor de pecho, especialmente al respirar.  Tos.  Ansiedad o inquietud.  Cristy Hilts. Por lo general, esto est presente si hay una infeccin subyacente, como neumona. Cmo se diagnostica? Esta afeccin se diagnostica en funcin de lo siguiente:  Sus sntomas.  Antecedentes mdicos.  Un examen fsico. Durante el examen, el mdico escuchar el corazn y tratar de Hydrographic surveyor sonidos crepitantes o sibilancias en los pulmones. Tambin se pueden realizar otros exmenes para Physicist, medical diagnstico y medir cmo estn funcionando los pulmones. Estos pueden incluir lo siguiente:  Medicin de la cantidad de oxgeno [employer]. El  [employer] mtodos para Optometrist este procedimiento: ? Un aparato pequeo (pulsioxmetro) que se coloca en el dedo, en el lbulo o en el pie. ? Estudio de gases en sangre arterial. Se toma una muestra de [place] de una arteria y se examinan los niveles de oxgeno.  Anlisis de [place].  Radiografa de pecho o exploracin por tomografa computarizada (TC) para controlar el lquido en los pulmones.  Se toma una muestra del esputo para examinar la infeccin.  Estudios del Training and development officer, como un electrocardiograma o Optometrist. Esto se realiza para Adult nurse problema cardaco (como insuficiencia cardaca) que pudiera estar causando los sntomas.  Broncoscopia. En [employer], se introduce un tubo delgado y flexible por la boca o la nariz, que atraviesa la trquea y llega a los pulmones. Cmo se trata? El tratamiento depende de la causa de la afeccin. El objetivo es dar soporte mientras los pulmones se curan y se trata la causa subyacente. El tratamiento puede incluir lo siguiente:  Oxigenoterapia. Esto puede lograrse a travs de: ? Un tubo en la Doran Durand o una mscara facial. ? Respirador. Este dispositivo ayuda a que se Runner, broadcasting/film/video aire dentro y fuera de los pulmones mediante un tubo de respiracin insertado en la boca o en la Doran Durand.  Presin positiva de las vas areas continua (PPC). Este tratamiento utiliza una presin de aire leve para Theatre manager las vas respiratorias abiertas. Se le colocar Geographical information systems officer u otro dispositivo en la nariz o la boca.  Traqueostoma. Durante el procedimiento, se hace un pequeo corte en el cuello para crear una apertura en la trquea. Se coloca un tubo de respiracin directamente en la trquea. El tubo se conecta a Animal nutritionist.  Esto se realiza en caso de que necesite usar el respirador por un perodo prolongado, o si tiene problemas en las vas respiratorias.  Colocarlo para que se recueste boca abajo (decbito prono).  Medicamentos, por  ejemplo: ? Sedantes para que se relaje. ? Medicamentos para la presin arterial. ? Antibiticos para tratar las infecciones. ? Anticoagulantes para prevenir cogulos de sangre. ? Diurticos para prevenir el exceso de lquido.  Lquidos y nutrientes a travs de un tubo (catter) intravenoso.  Usar medias de compresin para prevenir cogulos de Biochemist, clinical.  Oxigenacin por membrana extracorprea (OMEC). En este tratamiento se saca sangre del organismo, se le agrega oxgeno y se le elimina el dixido de carbono. Luego, la sangre vuelve al [employer]. Este tratamiento se [place] generalmente solo en casos graves. Siga estas indicaciones en su casa:  Tome los medicamentos de venta libre y los recetados solamente como se lo haya indicado el mdico.  No consuma ningn producto que contenga nicotina o tabaco, como cigarrillos y Psychologist, sport and exercise. Si necesita ayuda para dejar de fumar, consulte al mdico.  Limite el consumo de alcohol a no ms de 1 medida por da si es mujer y no est Music therapist, y a 2 medidas si es hombre. Una medida equivale a 12oz (326ml) de cerveza, 5oz (172ml) de vino o 1oz (65ml) de bebidas alcohlicas de alta graduacin.  Pdales a sus amigos y familiares que lo ayuden si las actividades cotidianas le producen cansancio.  Asista a rehabilitacin pulmonar tal como se lo haya indicado el mdico. Esto puede incluir lo siguiente: ? Educacin Research officer, trade union. ? Ejercicios. ? Capacitacin para respirar. ? Psicoterapia. ? Aprender tcnicas para conservar la energa. ? Asesoramiento sobre nutricin.  Concurra a todas las visitas de control como se lo haya indicado el mdico. Esto es importante. Comunquese con un mdico si:  Le falta el aire al hacer una actividad o al estar en reposo.  Tiene tos que no desaparece.  Tiene fiebre.  Los sntomas no mejoran o empeoran.  Se siente ansioso o deprimido. Solicite ayuda de inmediato si:  Le falta el aire de forma  repentina.  Siente un dolor repentino en el pecho que no desaparece.  Se le acelera la frecuencia cardaca.  Presenta dolor o hinchazn en una de las piernas.  Tose y escupe sangre.  Tiene dificultad para respirar.  Su piel, labios o uas se tornan azules. Estos sntomas pueden representar un problema grave que constituye Engineer, maintenance (IT). No espere hasta que los sntomas desaparezcan. Solicite atencin mdica de inmediato. Comunquese con el servicio de emergencias de su localidad (911 en los [place]). No conduzca por sus propios medios [employer] Financial. Resumen  El sndrome de dificultad respiratoria aguda es una afeccin potencialmente mortal en la que se acumula lquido en los pulmones, y esto provoca la falla de los pulmones o dems rganos vitales.  Por lo general, la afeccin aparece despus de una infeccin, enfermedad, ciruga o lesin.  Los sntomas principales del sndrome de dificultad respiratoria aguda son la falta de aire repentina y la respiracin acelerada.  El tratamiento puede incluir oxigenoterapia, presin positiva de las vas areas continua (PPC), traqueostoma, recostarse boca abajo (decbito prono), medicamentos, lquidos y nutrientes suministrados por un tubo (catter) intravenoso, medias de compresin y oxigenacin por Patent attorney extracorprea (OMEC). Esta informacin no tiene Marine scientist el consejo del mdico. Asegrese de hacerle al mdico cualquier pregunta que tenga. Document Released: 02/13/2009 Document Revised: 05/12/2017 Document Reviewed: 05/12/2017 Elsevier Interactive Patient Education  2019 Elsevier  Inc.

## 2019-04-26 NOTE — Progress Notes (Signed)
Physical Therapy Treatment Patient Details Name: Charles Stafford MRN: [id number] DOB: 10-28-1933 Today's Date: 04/26/2019    History of Present Illness Patient is an 85 year old who came to the emergency department because of increasing shortness of breath.  He got worse prior to admission on the night prior to admission.  He has a long smoking history and has been short of breath at home.  He has been coughing a little bit.  No hemoptysis.  He is very thin but he does not say that he is lost any weight.  He is unaware of any family history of lung disease or cancer.  He is not having any chest pain.  No nausea vomiting diarrhea.  No headaches    PT Comments    Patient demonstrates slightly labored cadence with occasional staggering left/right without losing balance using quad-cane, required sitting rest break for approximately 3-4 minutes due to SOB and SpO2 desaturation from 94% down to 85% while on room air before walking back to room - RN notified.  Patient tolerated sitting up in chair after therapy with 2 LPM O2 reapplied.  Patient will benefit from continued physical therapy in hospital and recommended venue below to increase strength, balance, endurance for safe ADLs and gait.   Follow Up Recommendations  Home health PT     Equipment Recommendations  Cane(need replacement cane secondary his lost in hospital)    Recommendations for Other Services       Precautions / Restrictions Precautions Precautions: Fall Restrictions Weight Bearing Restrictions: No    Mobility  Bed Mobility Overal bed mobility: Independent                Transfers Overall transfer level: Needs assistance Equipment used: Quad cane Transfers: Sit to/from [facility] Sit to Stand: Supervision Stand pivot transfers: Supervision       General transfer comment: increased time, labored movement  Ambulation/Gait Ambulation/Gait assistance: Supervision;Min guard Gait Distance  (Feet): 100 Feet Assistive device: Quad cane Gait Pattern/deviations: Step-through pattern;Decreased step length - left;Decreased step length - right;Decreased stride length;Staggering left;Staggering right Gait velocity: decreased   General Gait Details: slightly labored cadence with occasional staggering left/right without losing balance, required sitting rest break due to SOB before ambulating back to room, on room air with SpO2 dropping from 94% to 85%   Stairs             Wheelchair Mobility    Modified Rankin (Stroke Patients Only)       Balance Overall balance assessment: Needs assistance Sitting-balance support: Feet supported;No upper extremity supported Sitting balance-Leahy Scale: Good Sitting balance - Comments: sitting at EOB   Standing balance support: Single extremity supported;During functional activity Standing balance-Leahy Scale: Fair Standing balance comment: using quad-cane                            Cognition Arousal/Alertness: Awake/alert Behavior During Therapy: WFL for tasks assessed/performed Overall Cognitive Status: Within Functional Limits for tasks assessed                                        Exercises General Exercises - Lower Extremity Ankle Circles/Pumps: Seated;AROM;Both;15 reps Long Arc Quad: Seated;AROM;Strengthening;15 reps Hip Flexion/Marching: Seated;AROM;Strengthening;15 reps    General Comments        Pertinent Vitals/Pain Pain Assessment: No/denies pain    Home Living  Prior Function            PT Goals (current goals can now be found in the care plan section) Acute Rehab PT Goals Patient Stated Goal: return home Time For Goal Achievement: 05/02/19 Potential to Achieve Goals: Good Progress towards PT goals: Progressing toward goals    Frequency    Min 3X/week      PT Plan Current plan remains appropriate    Co-evaluation               AM-PAC PT "6 Clicks" Mobility   Outcome Measure  Help needed turning from your back to your side while in a flat bed without using bedrails?: None Help needed moving from lying on your back to sitting on the side of a flat bed without using bedrails?: None Help needed moving to and from a bed to a chair (including a wheelchair)?: A Little Help needed standing up from a chair using your arms (e.g., wheelchair or bedside chair)?: A Little Help needed to walk in hospital room?: A Little Help needed climbing 3-5 steps with a railing? : A Little 6 Click Score: 20    End of Session Equipment Utilized During Treatment: Gait belt;Oxygen Activity Tolerance: Patient tolerated treatment well;Patient limited by fatigue Patient left: in chair Nurse Communication: Mobility status PT Visit Diagnosis: Unsteadiness on feet (R26.81);Other abnormalities of gait and mobility (R26.89);History of falling (Z91.81)     Time: 0929-5747 PT Time Calculation (min) (ACUTE ONLY): 33 min  Charges:  $Gait Training: 8-22 mins $Therapeutic Exercise: 8-22 mins                     11:56 AM, 04/26/19 Lonell Grandchild, MPT Physical Therapist with [facility] 336 [phone] office [phone] mobile phone

## 2019-04-26 NOTE — Progress Notes (Signed)
SATURATION QUALIFICATIONS: (This note is used to comply with regulatory documentation for home oxygen)  Patient Saturations on Room Air at Rest = 90%  Patient Saturations on Room Air while Ambulating = 86%  Patient Saturations on 2 Liters of oxygen while Ambulating = 93%

## 2019-04-26 NOTE — Addendum Note (Signed)
Addendum  created 04/26/19 1109 by Mickel Baas, CRNA   Charge Capture section accepted

## 2019-04-28 ENCOUNTER — Other Ambulatory Visit (HOSPITAL_COMMUNITY): Payer: Self-pay | Admitting: Pulmonary Disease

## 2019-04-28 DIAGNOSIS — R918 Other nonspecific abnormal finding of lung field: Secondary | ICD-10-CM

## 2019-05-02 ENCOUNTER — Other Ambulatory Visit (HOSPITAL_COMMUNITY): Payer: Self-pay | Admitting: Pulmonary Disease

## 2019-05-02 DIAGNOSIS — R918 Other nonspecific abnormal finding of lung field: Secondary | ICD-10-CM

## 2019-05-04 ENCOUNTER — Encounter (HOSPITAL_COMMUNITY): Payer: Self-pay | Admitting: Hematology

## 2019-05-04 ENCOUNTER — Other Ambulatory Visit: Payer: Self-pay

## 2019-05-04 ENCOUNTER — Inpatient Hospital Stay (HOSPITAL_COMMUNITY): Payer: Medicare Other | Attending: Hematology | Admitting: Hematology

## 2019-05-04 VITALS — BP 123/45 | HR 106 | Temp 98.4°F | Resp 18 | Wt 110.0 lb

## 2019-05-04 DIAGNOSIS — C3491 Malignant neoplasm of unspecified part of right bronchus or lung: Secondary | ICD-10-CM | POA: Diagnosis not present

## 2019-05-04 DIAGNOSIS — C771 Secondary and unspecified malignant neoplasm of intrathoracic lymph nodes: Secondary | ICD-10-CM | POA: Insufficient documentation

## 2019-05-04 DIAGNOSIS — Z7952 Long term (current) use of systemic steroids: Secondary | ICD-10-CM | POA: Insufficient documentation

## 2019-05-04 DIAGNOSIS — R634 Abnormal weight loss: Secondary | ICD-10-CM

## 2019-05-04 DIAGNOSIS — Z8041 Family history of malignant neoplasm of ovary: Secondary | ICD-10-CM

## 2019-05-04 DIAGNOSIS — R0609 Other forms of dyspnea: Secondary | ICD-10-CM | POA: Diagnosis not present

## 2019-05-04 DIAGNOSIS — C3411 Malignant neoplasm of upper lobe, right bronchus or lung: Secondary | ICD-10-CM | POA: Insufficient documentation

## 2019-05-04 DIAGNOSIS — J439 Emphysema, unspecified: Secondary | ICD-10-CM

## 2019-05-04 DIAGNOSIS — R0789 Other chest pain: Secondary | ICD-10-CM | POA: Insufficient documentation

## 2019-05-04 DIAGNOSIS — I700 Atherosclerosis of aorta: Secondary | ICD-10-CM | POA: Diagnosis not present

## 2019-05-04 DIAGNOSIS — F17210 Nicotine dependence, cigarettes, uncomplicated: Secondary | ICD-10-CM | POA: Diagnosis not present

## 2019-05-04 DIAGNOSIS — Z79899 Other long term (current) drug therapy: Secondary | ICD-10-CM

## 2019-05-04 DIAGNOSIS — Z7951 Long term (current) use of inhaled steroids: Secondary | ICD-10-CM | POA: Diagnosis not present

## 2019-05-04 DIAGNOSIS — M25511 Pain in right shoulder: Secondary | ICD-10-CM | POA: Insufficient documentation

## 2019-05-04 NOTE — Patient Instructions (Signed)
[place] at [facility] Discharge Instructions  You were seen today by Dr. Delton Coombes. He went over your history, family history, and how you;ve been feeling lately. He would like you to have a PET scan to further evaluate. He would also like you to have a biopsy as well.  He will see you back after your scans for follow up.   Thank you for choosing [place] at [facility] to provide your oncology and hematology care.  To afford each patient quality time with our provider, please arrive at least 15 minutes before your scheduled appointment time.   If you have a lab appointment with the [place] please come in thru the  Main Entrance and check in at the main information desk  You need to re-schedule your appointment should you arrive 10 or more minutes late.  We strive to give you quality time with our providers, and arriving late affects you and other patients whose appointments are after yours.  Also, if you no show three or more times for appointments you may be dismissed from the clinic at the providers discretion.     Again, thank you for choosing [facility].  Our hope is that these requests will decrease the amount of time that you wait before being seen by our physicians.       _____________________________________________________________  Should you have questions after your visit to [facility], please contact our office at [phone] between the hours of 8:00 a.m. and 4:30 p.m.  Voicemails left after 4:00 p.m. will not be returned until the following business day.  For prescription refill requests, have your pharmacy contact our office and allow 72 hours.    Cancer Center Support Programs:   > Cancer Support Group  2nd Tuesday of the month 1pm-2pm, Journey Room

## 2019-05-04 NOTE — Assessment & Plan Note (Addendum)
1.  Right lung mass: - Recent onset right-sided chest pain, went to the ER, CT chest was done. -CT of the chest with contrast on 04/24/2019 shows right upper lobe lung mass measuring 7.1 x 6.6 x 6.5cm.  This was highly suspicious for bronchogenic carcinoma.  Mass invades into the right suprahilar mediastinum with associated soft tissue material in the right lower paratracheal space.  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  Compression fractures of T3, T7 and T9 vertebral bodies, uncertain age, most likely chronic. - He underwent bronchoscopy on 04/25/2019 by Dr. Luan Pulling.  There was extrinsic compression of trachea noted.  No endobronchial lesions were seen.  Endobronchial brushings were taken. - Pathology report shows atypical cells suspicious for malignancy. - He is accompanied by his daughter.  Had about 20 pound weight loss in the last few months.  He is only drinking milk for the last 2 weeks. - He has smoked 1 pack/day for 68 years.  He worked as a Interior and spatial designer. - I had a prolonged discussion with the patient and his daughter about the findings on the CT scan.  I have ordered a whole-body PET CT scan and MRI of the brain with and without contrast for restaging work-up. -I have also scheduled him for a CT-guided biopsy of the right lung mass. -I will see him back after the biopsy to discuss further plan of action.  2.  Weight loss: -He has 20 pound weight loss in the last several months. - He drinks only milk for the last couple of weeks.  Not eating much of solid food.  I have suggested him to try Ensure clear as he did not like the regular Ensure.  3.  Family history: -Daughter died of ovarian cancer at age 83.
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
## 2019-05-05 ENCOUNTER — Other Ambulatory Visit: Payer: Self-pay | Admitting: *Deleted

## 2019-05-05 ENCOUNTER — Encounter: Payer: Self-pay | Admitting: *Deleted

## 2019-05-05 NOTE — Progress Notes (Signed)
The proposed treatment discussed in cancer conference on 05/05/2019 is for discussion purpose only and is not a binding recommendation.  The patient was not physically examined nor present for their treatment options. Therefore, final treatment plans cannot be decided.

## 2019-05-05 NOTE — Progress Notes (Signed)
Dr. Delton Coombes update on cancer conference 05/05/2019 recommendations.

## 2019-05-06 ENCOUNTER — Ambulatory Visit (HOSPITAL_COMMUNITY)
Admission: RE | Admit: 2019-05-06 | Discharge: 2019-05-06 | Disposition: A | Discharge status: Home / Self Care | Payer: Medicare Other | Source: Ambulatory Visit | Attending: Hematology | Admitting: Hematology

## 2019-05-06 ENCOUNTER — Other Ambulatory Visit (HOSPITAL_COMMUNITY): Payer: Self-pay | Admitting: Hematology

## 2019-05-06 ENCOUNTER — Other Ambulatory Visit: Payer: Self-pay

## 2019-05-06 DIAGNOSIS — C3491 Malignant neoplasm of unspecified part of right bronchus or lung: Secondary | ICD-10-CM

## 2019-05-09 ENCOUNTER — Ambulatory Visit (HOSPITAL_COMMUNITY): Payer: Medicare Other

## 2019-05-09 ENCOUNTER — Encounter (HOSPITAL_COMMUNITY): Payer: Self-pay

## 2019-05-09 ENCOUNTER — Other Ambulatory Visit (HOSPITAL_COMMUNITY): Payer: Self-pay | Admitting: *Deleted

## 2019-05-09 DIAGNOSIS — C3491 Malignant neoplasm of unspecified part of right bronchus or lung: Secondary | ICD-10-CM

## 2019-05-11 ENCOUNTER — Encounter (HOSPITAL_COMMUNITY): Payer: Self-pay

## 2019-05-11 ENCOUNTER — Other Ambulatory Visit (HOSPITAL_COMMUNITY): Payer: Medicare Other

## 2019-05-13 ENCOUNTER — Other Ambulatory Visit: Payer: Self-pay

## 2019-05-13 ENCOUNTER — Ambulatory Visit (HOSPITAL_COMMUNITY)
Admission: RE | Admit: 2019-05-13 | Discharge: 2019-05-13 | Disposition: A | Discharge status: Home / Self Care | Payer: Medicare Other | Source: Ambulatory Visit | Attending: Hematology | Admitting: Hematology

## 2019-05-13 DIAGNOSIS — C3491 Malignant neoplasm of unspecified part of right bronchus or lung: Secondary | ICD-10-CM | POA: Insufficient documentation

## 2019-05-13 MED ORDER — IOHEXOL 300 MG/ML  SOLN
75.0000 mL | Freq: Once | INTRAMUSCULAR | Status: AC | PRN
  Administered 2019-05-13: 12:00:00 75 mL via INTRAVENOUS

## 2019-05-16 ENCOUNTER — Ambulatory Visit (HOSPITAL_COMMUNITY): Payer: Medicare Other | Admitting: Hematology

## 2019-05-19 ENCOUNTER — Other Ambulatory Visit: Payer: Self-pay

## 2019-05-19 ENCOUNTER — Ambulatory Visit (HOSPITAL_COMMUNITY)
Admission: RE | Admit: 2019-05-19 | Discharge: 2019-05-19 | Disposition: A | Discharge status: Home / Self Care | Payer: Medicare Other | Source: Ambulatory Visit | Attending: Hematology | Admitting: Hematology

## 2019-05-19 DIAGNOSIS — C3491 Malignant neoplasm of unspecified part of right bronchus or lung: Secondary | ICD-10-CM | POA: Insufficient documentation

## 2019-05-19 DIAGNOSIS — C771 Secondary and unspecified malignant neoplasm of intrathoracic lymph nodes: Secondary | ICD-10-CM | POA: Diagnosis not present

## 2019-05-19 DIAGNOSIS — Z79899 Other long term (current) drug therapy: Secondary | ICD-10-CM | POA: Diagnosis not present

## 2019-05-19 DIAGNOSIS — J929 Pleural plaque without asbestos: Secondary | ICD-10-CM | POA: Insufficient documentation

## 2019-05-19 LAB — GLUCOSE, CAPILLARY: Glucose-Capillary: 85 mg/dL (ref 70–99)

## 2019-05-19 MED ORDER — FLUDEOXYGLUCOSE F - 18 (FDG) INJECTION
5.6000 | Freq: Once | INTRAVENOUS | Status: AC | PRN
  Administered 2019-05-19: 5.6 via INTRAVENOUS

## 2019-05-24 ENCOUNTER — Other Ambulatory Visit: Payer: Self-pay

## 2019-05-24 ENCOUNTER — Inpatient Hospital Stay (HOSPITAL_BASED_OUTPATIENT_CLINIC_OR_DEPARTMENT_OTHER): Payer: Medicare Other | Admitting: Hematology

## 2019-05-24 ENCOUNTER — Encounter (HOSPITAL_COMMUNITY): Payer: Self-pay | Admitting: Hematology

## 2019-05-24 VITALS — BP 120/48 | HR 94 | Temp 98.5°F | Resp 16 | Wt 105.4 lb

## 2019-05-24 DIAGNOSIS — R0609 Other forms of dyspnea: Secondary | ICD-10-CM | POA: Diagnosis not present

## 2019-05-24 DIAGNOSIS — R0789 Other chest pain: Secondary | ICD-10-CM | POA: Diagnosis not present

## 2019-05-24 DIAGNOSIS — C3411 Malignant neoplasm of upper lobe, right bronchus or lung: Secondary | ICD-10-CM | POA: Diagnosis not present

## 2019-05-24 DIAGNOSIS — Z8041 Family history of malignant neoplasm of ovary: Secondary | ICD-10-CM

## 2019-05-24 DIAGNOSIS — Z79899 Other long term (current) drug therapy: Secondary | ICD-10-CM

## 2019-05-24 DIAGNOSIS — Z7952 Long term (current) use of systemic steroids: Secondary | ICD-10-CM

## 2019-05-24 DIAGNOSIS — C771 Secondary and unspecified malignant neoplasm of intrathoracic lymph nodes: Secondary | ICD-10-CM | POA: Diagnosis not present

## 2019-05-24 DIAGNOSIS — F17210 Nicotine dependence, cigarettes, uncomplicated: Secondary | ICD-10-CM

## 2019-05-24 DIAGNOSIS — R634 Abnormal weight loss: Secondary | ICD-10-CM

## 2019-05-24 DIAGNOSIS — I700 Atherosclerosis of aorta: Secondary | ICD-10-CM

## 2019-05-24 DIAGNOSIS — M25511 Pain in right shoulder: Secondary | ICD-10-CM

## 2019-05-24 DIAGNOSIS — J439 Emphysema, unspecified: Secondary | ICD-10-CM

## 2019-05-24 DIAGNOSIS — Z7951 Long term (current) use of inhaled steroids: Secondary | ICD-10-CM

## 2019-05-24 MED ORDER — DRONABINOL 2.5 MG PO CAPS: 2.5000 mg | ORAL_CAPSULE | Freq: Two times a day (BID) | ORAL | 0 refills | Status: AC

## 2019-05-24 NOTE — Progress Notes (Signed)
Patient Care Team: Lucia Gaskins, MD as PCP - General (Internal Medicine)  DIAGNOSIS:  Encounter Diagnoses  Name Primary?  . Weight loss Yes  . Malignant neoplasm of upper lobe of right lung ([place])     SUMMARY OF ONCOLOGIC HISTORY: Oncology History   No history exists.    CHIEF COMPLIANT: Right lung mass.  INTERVAL HISTORY: Charles Stafford is a 85 y.o. very pleasant male seen in follow-up for right lung mass.  I have ordered brain scan and PET scan at prior visit.  He lost another 5 pounds from last visit.  He is not able to tolerate boost or Ensure.  He is not eating much solid foods.  He does not have good appetite.  He does report some pain in the right shoulder area on and off.  He does report some shortness of breath on exertion.  His saturations today are 78 percent on room air.  He is refusing to wear oxygen.  He is accompanied by his daughter today.  Very low appetite and energy levels reported.  He walks with the help of a cane.  REVIEW OF SYSTEMS:   Constitutional: Positive for weight loss.  Denies any fevers or chills. Eyes: Denies blurriness of vision Ears, nose, mouth, throat, and face: Denies mucositis or sore throat Respiratory: Positive for shortness of breath on exertion.  Stable cough. Cardiovascular: Denies palpitation, chest discomfort Gastrointestinal:  Denies nausea, heartburn or change in bowel habits.  Positive for decreased appetite. Skin: Denies abnormal skin rashes Lymphatics: Denies new lymphadenopathy or easy bruising Neurological:Denies numbness, tingling or new weaknesses Behavioral/Psych: Mood is stable, no new changes  Extremities: No lower extremity edema All other systems were reviewed with the patient and are negative.  I have reviewed the past medical history, past surgical history, social history and family history with the patient and they are unchanged from previous note.  ALLERGIES:  has No Known Allergies.  MEDICATIONS:  Current  Outpatient Medications  Medication Sig Dispense Refill  . Albuterol Sulfate 108 (90 Base) MCG/ACT AEPB Inhale 2 puffs into the lungs 4 (four) times daily.    . varenicline (CHANTIX) 1 MG tablet Take 1 mg by mouth 2 (two) times daily. Pt has not started yet    . dronabinol (MARINOL) 2.5 MG capsule Take 1 capsule (2.5 mg total) by mouth 2 (two) times daily before a meal. 30 capsule 0   No current facility-administered medications for this visit.     PHYSICAL EXAMINATION: ECOG PERFORMANCE STATUS: 2 - Symptomatic, <50% confined to bed  Vitals:   05/24/19 1100  BP: (!) 120/48  Pulse: 94  Resp: 16  Temp: 98.5 F (36.9 C)  SpO2: (!) 78%   Filed Weights   05/24/19 1100  Weight: 105 lb 6 oz (47.8 kg)    GENERAL:alert, no distress and comfortable SKIN: skin color, texture, turgor are normal, no rashes or significant lesions EYES: normal, Conjunctiva are pink and non-injected, sclera clear OROPHARYNX:no mucositis, no erythema and lips, buccal mucosa, and tongue normal  NECK: supple, thyroid normal size, non-tender, without nodularity LYMPH:  no palpable lymphadenopathy in the cervical, axillary or inguinal LUNGS: clear to auscultation and percussion with normal breathing effort HEART: regular rate & rhythm and no murmurs and no lower extremity edema ABDOMEN:abdomen soft, non-tender and normal bowel sounds MUSCULOSKELETAL:no cyanosis of digits and no clubbing  EXTREMITIES: No lower extremity edema   LABORATORY DATA:  I have reviewed the data as listed CMP Latest Ref Rng & Units 04/26/2019  04/25/2019 04/24/2019  Glucose 70 - 99 mg/dL 137(H) 162(H) 141(H)  BUN 8 - 23 mg/dL 35(H) 37(H) 27(H)  Creatinine 0.61 - 1.24 mg/dL 0.89 1.07 0.92  Sodium 135 - 145 mmol/L 136 131(L) 135  Potassium 3.5 - 5.1 mmol/L 5.0 4.6 4.3  Chloride 98 - 111 mmol/L 102 95(L) 96(L)  CO2 22 - 32 mmol/L 26 26 26   Calcium 8.9 - 10.3 mg/dL 8.6(L) 8.7(L) 8.7(L)  Total Protein 6.5 - 8.1 g/dL - 6.6 -  Total Bilirubin  0.3 - 1.2 mg/dL - 0.4 -  Alkaline Phos 38 - 126 U/L - 121 -  AST 15 - 41 U/L - 15 -  ALT 0 - 44 U/L - 12 -   No results found for: [id number]   Lab Results  Component Value Date   WBC 14.2 (H) 04/25/2019   HGB 8.4 (L) 04/25/2019   HCT 28.7 (L) 04/25/2019   MCV 71.9 (L) 04/25/2019   PLT 346 04/25/2019   NEUTROABS 8.9 (H) 04/24/2019   I have independently reviewed PET CT scan and CT scan of the brain.  ASSESSMENT & PLAN:  Malignant neoplasm of right lung ([place]) 1.  Right lung mass: - Recent onset right-sided chest pain, went to the ER, CT chest was done. -CT of the chest with contrast on 04/24/2019 shows right upper lobe lung mass measuring 7.1 x 6.6 x 6.5cm.  This was highly suspicious for bronchogenic carcinoma.  Mass invades into the right suprahilar mediastinum with associated soft tissue material in the right lower paratracheal space.  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  Compression fractures of T3, T7 and T9 vertebral bodies, uncertain age, most likely chronic. - Bronchoscopy on 04/25/2019 by Dr. Luan Pulling showing extrinsic compression of the trachea with no endobronchial lesions.  Endobronchial brushings were suspicious for malignancy with atypical cells.   - He smoked 1 pack/day for 68 years.  He worked as a Interior and spatial designer. - We reviewed results of CT head with and without contrast which did not show evidence of brain metastasis.  6 mm lucency in the left parietal skull, indeterminate. -PET/CT scan on 05/19/2019 showed intensely hypermetabolic right upper lobe mass consistent with primary bronchogenic carcinoma.  Differential diagnosis includes lung cancer versus lymphoma.  Hypermetabolic ipsilateral and contralateral mediastinal nodal metastasis.  Hypermetabolic pleural thickening in the right hemithorax. - I have recommended CT-guided biopsy of the lung lesion.  I will see him back after the biopsy.  2.  Weight  loss: -He has 20 pound weight loss in the last several months. - He did not like drinking Ensure or boost.  We will start him on Marinol 2.5 mg twice daily.  3.  Family history: -Daughter died of ovarian cancer at age 54.   Total time spent is 25 minutes with more than 50% of the time spent face-to-face discussing scan results, further work-up and coordination of care. No orders of the defined types were placed in this encounter.  The patient has a good understanding of the overall plan. he agrees with it. he will call with any problems that may develop before the next visit here.  Derek Jack, MD 05/24/19

## 2019-05-24 NOTE — Assessment & Plan Note (Signed)
1.  Right lung mass: - Recent onset right-sided chest pain, went to the ER, CT chest was done. -CT of the chest with contrast on 04/24/2019 shows right upper lobe lung mass measuring 7.1 x 6.6 x 6.5cm.  This was highly suspicious for bronchogenic carcinoma.  Mass invades into the right suprahilar mediastinum with associated soft tissue material in the right lower paratracheal space.  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  Compression fractures of T3, T7 and T9 vertebral bodies, uncertain age, most likely chronic. - Bronchoscopy on 04/25/2019 by Dr. Luan Pulling showing extrinsic compression of the trachea with no endobronchial lesions.  Endobronchial brushings were suspicious for malignancy with atypical cells.   - He smoked 1 pack/day for 68 years.  He worked as a Interior and spatial designer. - We reviewed results of CT head with and without contrast which did not show evidence of brain metastasis.  6 mm lucency in the left parietal skull, indeterminate. -PET/CT scan on 05/19/2019 showed intensely hypermetabolic right upper lobe mass consistent with primary bronchogenic carcinoma.  Differential diagnosis includes lung cancer versus lymphoma.  Hypermetabolic ipsilateral and contralateral mediastinal nodal metastasis.  Hypermetabolic pleural thickening in the right hemithorax. - I have recommended CT-guided biopsy of the lung lesion.  I will see him back after the biopsy.  2.  Weight loss: -He has 20 pound weight loss in the last several months. - He did not like drinking Ensure or boost.  We will start him on Marinol 2.5 mg twice daily.  3.  Family history: -Daughter died of ovarian cancer at age 60.

## 2019-05-30 ENCOUNTER — Other Ambulatory Visit (HOSPITAL_COMMUNITY)
Admission: RE | Admit: 2019-05-30 | Discharge: 2019-05-30 | Disposition: A | Discharge status: Home / Self Care | Payer: Medicare Other | Source: Ambulatory Visit | Attending: Hematology | Admitting: Hematology

## 2019-05-30 DIAGNOSIS — Z1159 Encounter for screening for other viral diseases: Secondary | ICD-10-CM | POA: Insufficient documentation

## 2019-05-30 DIAGNOSIS — Z01812 Encounter for preprocedural laboratory examination: Secondary | ICD-10-CM | POA: Insufficient documentation

## 2019-05-30 LAB — SARS CORONAVIRUS 2 (TAT 6-24 HRS): SARS Coronavirus 2: NEGATIVE

## 2019-06-01 ENCOUNTER — Other Ambulatory Visit: Payer: Self-pay | Admitting: Radiology

## 2019-06-02 ENCOUNTER — Ambulatory Visit (HOSPITAL_COMMUNITY)
Admission: RE | Admit: 2019-06-02 | Discharge: 2019-06-02 | Disposition: A | Discharge status: Home / Self Care | Payer: Medicare Other | Source: Ambulatory Visit | Attending: Hematology | Admitting: Hematology

## 2019-06-02 ENCOUNTER — Ambulatory Visit (HOSPITAL_COMMUNITY)
Admission: RE | Admit: 2019-06-02 | Discharge: 2019-06-02 | Disposition: A | Discharge status: Home / Self Care | Payer: Medicare Other | Source: Ambulatory Visit | Attending: Interventional Radiology | Admitting: Interventional Radiology

## 2019-06-02 ENCOUNTER — Other Ambulatory Visit: Payer: Self-pay

## 2019-06-02 DIAGNOSIS — J9811 Atelectasis: Secondary | ICD-10-CM | POA: Diagnosis not present

## 2019-06-02 DIAGNOSIS — C3491 Malignant neoplasm of unspecified part of right bronchus or lung: Secondary | ICD-10-CM | POA: Diagnosis not present

## 2019-06-02 DIAGNOSIS — Z79899 Other long term (current) drug therapy: Secondary | ICD-10-CM | POA: Diagnosis not present

## 2019-06-02 DIAGNOSIS — J449 Chronic obstructive pulmonary disease, unspecified: Secondary | ICD-10-CM | POA: Insufficient documentation

## 2019-06-02 DIAGNOSIS — F17210 Nicotine dependence, cigarettes, uncomplicated: Secondary | ICD-10-CM | POA: Diagnosis not present

## 2019-06-02 DIAGNOSIS — C771 Secondary and unspecified malignant neoplasm of intrathoracic lymph nodes: Secondary | ICD-10-CM | POA: Insufficient documentation

## 2019-06-02 DIAGNOSIS — R918 Other nonspecific abnormal finding of lung field: Secondary | ICD-10-CM | POA: Diagnosis not present

## 2019-06-02 DIAGNOSIS — J95811 Postprocedural pneumothorax: Secondary | ICD-10-CM | POA: Diagnosis not present

## 2019-06-02 LAB — CBC
HCT: 36.3 % — ABNORMAL LOW (ref 39.0–52.0)
Hemoglobin: 10.5 g/dL — ABNORMAL LOW (ref 13.0–17.0)
MCH: 22.8 pg — ABNORMAL LOW (ref 26.0–34.0)
MCHC: 28.9 g/dL — ABNORMAL LOW (ref 30.0–36.0)
MCV: 78.9 fL — ABNORMAL LOW (ref 80.0–100.0)
Platelets: 373 10*3/uL (ref 150–400)
RBC: 4.6 MIL/uL (ref 4.22–5.81)
RDW: 21.7 % — ABNORMAL HIGH (ref 11.5–15.5)
WBC: 14.4 10*3/uL — ABNORMAL HIGH (ref 4.0–10.5)
nRBC: 0 % (ref 0.0–0.2)

## 2019-06-02 LAB — PROTIME-INR
INR: 1 (ref 0.8–1.2)
Prothrombin Time: 13.5 seconds (ref 11.4–15.2)

## 2019-06-02 MED ORDER — MIDAZOLAM HCL 2 MG/2ML IJ SOLN
INTRAMUSCULAR | Status: AC | PRN
  Administered 2019-06-02: 1 mg via INTRAVENOUS

## 2019-06-02 MED ORDER — MIDAZOLAM HCL 2 MG/2ML IJ SOLN
INTRAMUSCULAR | Status: AC
  Filled 2019-06-02: qty 2

## 2019-06-02 MED ORDER — FENTANYL CITRATE (PF) 100 MCG/2ML IJ SOLN
INTRAMUSCULAR | Status: AC
  Filled 2019-06-02: qty 2

## 2019-06-02 MED ORDER — FENTANYL CITRATE (PF) 100 MCG/2ML IJ SOLN
INTRAMUSCULAR | Status: AC | PRN
  Administered 2019-06-02: 50 ug via INTRAVENOUS

## 2019-06-02 MED ORDER — SODIUM CHLORIDE 0.9 % IV SOLN: INTRAVENOUS | Status: DC

## 2019-06-02 MED ORDER — LIDOCAINE HCL 1 % IJ SOLN
INTRAMUSCULAR | Status: AC
  Filled 2019-06-02: qty 20

## 2019-06-02 NOTE — Sedation Documentation (Signed)
Patient breathing through mouth. Unable to detect ETCO2 at times due to this.
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
## 2019-06-02 NOTE — Progress Notes (Signed)
Radiology PA came to assess patient .  Patient c/o not feeling well and had course breathsounds, 82-85 percent on 3 liters. Bumped to 4 liters on sats 95.  PA  Stated she would talk To radiologist .  PA from radiology stated xray looked good, had lil mucus that should resolved with c/d/b, use 2 liter O2 that he has ordered for home, and use incentive spirometer.  Weaned patient off O2, sat 92 percent RA, instructed patient how to use IS and demonstrated it.  Called and spoke to daughter and went over d/c instructions.  Patient's daughter stated he has o2 at home that he is supposed to use.  Patient's daughter will encourage him to use 02 and Is.

## 2019-06-02 NOTE — Progress Notes (Signed)
PA notified of patient with shortness of breath after lung biopsy today.   PA to bedside.  Patient sitting upright in bed, short of breath, on 4 L [place].  VSS.  HR 76, BP 153/67, O2 96%  Patient states he feels tightness in his chest.  Patient denies hemoptysis.  Lungs with course diffuse breath sounds, some crackles. Upper airway congestion.   CXR reviewed with Dr. Earleen Newport who notes no complications or pneumothorax. CT imaging performed during procedure today shows possible mucus in trachea.   Patient given incentive spirometer. Encouraged use and gentle coughs to clear airway.  Wean O2 this afternoon.  Patient with chronic hypoxia per notes from PCP and Dr. Lamonte Richer.  O2 sats typically high 80s, low 90s.  Patient has O2 at home for use.  Per recent d/c instructions he was discharged with 2L [place].  Patient confirms he has this equipment at home and can use as needed.   Continue to monitor.  Discharge plan remains appropriate unless WOB increases, unable to wean to </= 2L [place] or concerns features present.  Brynda Greathouse, MS RD PA-C

## 2019-06-02 NOTE — Procedures (Signed)
Interventional Radiology Procedure Note  Procedure: CT guided biopsy of right lung mass Complications: None Recommendations: - Bedrest until CXR cleared.  Minimize talking, coughing or otherwise straining.  - Follow up 1 hr CXR pending  - NPO until CXR cleared  Signed,  Corrie Mckusick, DO

## 2019-06-02 NOTE — Discharge Instructions (Signed)
Lung Biopsy, Care After This sheet gives you information about how to care for yourself after your procedure. Your health care provider may also give you more specific instructions depending on the type of biopsy you had. If you have problems or questions, contact your health care provider. What can I expect after the procedure? After the procedure, it is common to have:  A cough.  A sore throat.  Pain where a needle, bronchoscope, or incision was used to collect a biopsy sample (biopsy site). Follow these instructions at home: Medicines  Take over-the-counter and prescription medicines only as told by your health care provider.  Do not drink alcohol if your health care provider tells you not to drink.  Ask your health care provider if the medicine prescribed to you: ? Requires you to avoid driving or using heavy machinery. ? Can cause constipation. You may need to take these actions to prevent or treat constipation:  Drink enough fluid to keep your urine pale yellow.  Take over-the-counter or prescription medicines.  Eat foods that are high in fiber, such as beans, whole grains, and fresh fruits and vegetables.  Limit foods that are high in fat and processed sugars, such as fried or sweet foods.  Do not drive for 24 hours if you were given a sedative. Biopsy site care   Follow instructions from your health care provider about how to take care of your biopsy site. Make sure you: ? Wash your hands with soap and water before and after you change your bandage (dressing). If soap and water are not available, use hand sanitizer. ? Change your dressing as told by your health care provider. ? Leave stitches (sutures), skin glue, or adhesive strips in place. These skin closures may need to stay in place for 2 weeks or longer. If adhesive strip edges start to loosen and curl up, you may trim the loose edges. Do not remove adhesive strips completely unless your health care provider tells  you to do that.  Do not take baths, swim, or use a hot tub until your health care provider approves. Ask your health care provider if you may take showers. You may only be allowed to take sponge baths.  Check your biopsy site every day for signs of infection. Check for: ? Redness, swelling, or more pain. ? Fluid or blood. ? Warmth. ? Pus or a bad smell. General instructions  Return to your normal activities as told by your health care provider. Ask your health care provider what activities are safe for you.  It is up to you to get the results of your procedure. Ask your health care provider, or the department that is doing the procedure, when your results will be ready.  Keep all follow-up visits as told by your health care provider. This is important. Contact a health care provider if:  You have a fever.  You have redness, swelling, or more pain around your biopsy site.  You have fluid or blood coming from your biopsy site.  Your biopsy site feels warm to the touch.  You have pus or a bad smell coming from your biopsy site.  You have pain that does not get better with medicine. Get help right away if:  You cough up blood.  You have trouble breathing.  You have chest pain.  You lose consciousness. Summary  After the procedure, it is common to have a sore throat and a cough.  Return to your normal activities as told by your  health care provider. Ask your health care provider what activities are safe for you.  Take over-the-counter and prescription medicines only as told by your health care provider.  Report any unusual symptoms to your health care provider. This information is not intended to replace advice given to you by your health care provider. Make sure you discuss any questions you have with your health care provider. Document Released: 12/16/2016 Document Revised: 12/22/2018 Document Reviewed: 12/16/2016 Elsevier Patient Education  [place]  de pulmn, cuidados posteriores Lung Biopsy, Care After [employer] le brinda informacin sobre cmo cuidarse despus del procedimiento. El mdico tambin podr darle indicaciones ms especficas, segn el tipo de biopsia realizada. Comunquese con el mdico si tiene problemas o preguntas. Qu puedo esperar despus del procedimiento? Despus del procedimiento, es comn [employer] siguientes sntomas:  Tos.  Dolor de Investment banker, operational.  Dolor donde se [place], broncoscopio o donde se realiz una incisin para extraer la muestra para la biopsia (lugar de la biopsia). Siga estas instrucciones en su casa: Medicamentos  Use los medicamentos de venta libre y los recetados solamente como se lo haya indicado el mdico.  No beba alcohol si el mdico se lo prohbe.  Pregntele al mdico si el medicamento recetado: ? Hace que sea necesario que evite conducir o usar maquinaria pesada. ? Puede causarle estreimiento. Es posible que tenga que tomar estas medidas para prevenir o tratar el estreimiento:  Electronics engineer suficiente lquido como para Theatre manager la orina de color amarillo plido.  Usar medicamentos recetados o de Radio broadcast assistant.  Consumir alimentos ricos en fibra, como frijoles, cereales integrales, y frutas y verduras frescas.  Limitar el consumo de alimentos ricos en grasa y azcares procesados, como los alimentos fritos o dulces.  No conduzca durante 24horas si le administraron un sedante. Cuidado del Environmental consultant de la biopsia   Siga las indicaciones del mdico acerca de los cuidados en el lugar de la biopsia. Asegrese de hacer lo siguiente: ? Lvese las manos con agua y [place] antes y despus de cambiar la venda (vendaje). Use desinfectante para manos si no dispone de [place] y [place]. ? Cambie el vendaje como se lo haya indicado el mdico. ? No retire los puntos (suturas), la goma para cerrar la piel o las tiras [place]. Es posible que estos cierres cutneos deban quedar puestos en la piel durante  2semanas o ms tiempo. Si los bordes de las tiras adhesivas empiezan a despegarse y Therapist, sports, puede recortar los que estn sueltos. No retire las tiras [facility] por completo a menos que el mdico se lo indique.  No tome baos de inmersin, no nade ni use el jacuzzi hasta que el mdico lo autorice. Pregntele al mdico si puede ducharse. Thurston Pounds solo le permitan darse baos de [place].  Verifique TEFL teacher de la biopsia todos los das para detectar signos de infeccin. Est atento a los siguientes signos: ? Enrojecimiento, hinchazn o ms dolor. ? Lquido o sangre. ? Calor. ? Pus o mal olor. Indicaciones generales  Retome sus actividades normales segn lo indicado por el mdico. Pregntele al mdico qu actividades son seguras para usted.  Es su responsabilidad retirar [employer] del procedimiento. Pregntele a su mdico, o a un miembro del personal del departamento donde se realice el procedimiento, cundo estarn [employer].  Concurra a todas las visitas de seguimiento como se lo haya indicado el mdico. Esto es importante. Comunquese con un mdico si:  Tener fiebre.  Tiene enrojecimiento, hinchazn o ms dolor  alrededor del lugar de la biopsia.  Observa lquido o sangre que salen del lugar de la biopsia.  El lugar de la biopsia se siente caliente al tacto.  Tiene pus o percibe mal olor que proviene del lugar de la biopsia.  Su dolor no se alivia con medicamentos. Solicite ayuda inmediatamente si:  Tose y [employer].  Tiene dificultad para respirar.  Siente dolor en el pecho.  Pierde el conocimiento. Resumen  Despus del procedimiento, es normal tener dolor de garganta y tos.  Retome sus actividades normales segn lo indicado por el mdico. Pregntele al mdico qu actividades son seguras para usted.  Use los medicamentos de venta libre y los recetados solamente como se lo haya indicado el mdico.  Infrmele al mdico si se manifiestan sntomas  fuera de lo comn. Esta informacin no tiene Marine scientist el consejo del mdico. Asegrese de hacerle al mdico cualquier pregunta que tenga. Document Released: 06/26/2017 Document Revised: 01/19/2019 Document Reviewed: 01/19/2019 Elsevier Patient Education  2020 [employer].

## 2019-06-09 ENCOUNTER — Encounter (HOSPITAL_COMMUNITY): Payer: Self-pay | Admitting: Hematology

## 2019-06-09 ENCOUNTER — Other Ambulatory Visit: Payer: Self-pay | Admitting: *Deleted

## 2019-06-09 ENCOUNTER — Other Ambulatory Visit: Payer: Self-pay

## 2019-06-09 ENCOUNTER — Inpatient Hospital Stay (HOSPITAL_COMMUNITY): Payer: Medicare Other | Attending: Hematology | Admitting: Hematology

## 2019-06-09 DIAGNOSIS — R0789 Other chest pain: Secondary | ICD-10-CM | POA: Insufficient documentation

## 2019-06-09 DIAGNOSIS — R634 Abnormal weight loss: Secondary | ICD-10-CM

## 2019-06-09 DIAGNOSIS — R042 Hemoptysis: Secondary | ICD-10-CM | POA: Diagnosis not present

## 2019-06-09 DIAGNOSIS — R0602 Shortness of breath: Secondary | ICD-10-CM | POA: Insufficient documentation

## 2019-06-09 DIAGNOSIS — R05 Cough: Secondary | ICD-10-CM | POA: Insufficient documentation

## 2019-06-09 DIAGNOSIS — M549 Dorsalgia, unspecified: Secondary | ICD-10-CM | POA: Diagnosis not present

## 2019-06-09 DIAGNOSIS — C3411 Malignant neoplasm of upper lobe, right bronchus or lung: Secondary | ICD-10-CM | POA: Insufficient documentation

## 2019-06-09 DIAGNOSIS — C771 Secondary and unspecified malignant neoplasm of intrathoracic lymph nodes: Secondary | ICD-10-CM | POA: Diagnosis not present

## 2019-06-09 DIAGNOSIS — Z79899 Other long term (current) drug therapy: Secondary | ICD-10-CM | POA: Insufficient documentation

## 2019-06-09 DIAGNOSIS — F17210 Nicotine dependence, cigarettes, uncomplicated: Secondary | ICD-10-CM | POA: Insufficient documentation

## 2019-06-09 NOTE — Patient Instructions (Signed)
[place] at [facility] Discharge Instructions  You were seen today by Dr. Delton Coombes. He went over your recent  results. He will see you back in 4 weeks for labs and follow up.   Thank you for choosing [place] at [facility] to provide your oncology and hematology care.  To afford each patient quality time with our provider, please arrive at least 15 minutes before your scheduled appointment time.   If you have a lab appointment with the [place] please come in thru the  Main Entrance and check in at the main information desk  You need to re-schedule your appointment should you arrive 10 or more minutes late.  We strive to give you quality time with our providers, and arriving late affects you and other patients whose appointments are after yours.  Also, if you no show three or more times for appointments you may be dismissed from the clinic at the providers discretion.     Again, thank you for choosing [facility].  Our hope is that these requests will decrease the amount of time that you wait before being seen by our physicians.       _____________________________________________________________  Should you have questions after your visit to [facility], please contact our office at [phone] between the hours of 8:00 a.m. and 4:30 p.m.  Voicemails left after 4:00 p.m. will not be returned until the following business day.  For prescription refill requests, have your pharmacy contact our office and allow 72 hours.    Cancer Center Support Programs:   > Cancer Support Group  2nd Tuesday of the month 1pm-2pm, Journey Room

## 2019-06-09 NOTE — Progress Notes (Signed)
The purposed treatment discussion at cancer conference 06/09/2019 is for discussion purpose only and is not a binding recommendation.  The patient was not physically examined nor present for their treatment options. Therefore, final treatment plans cannot be decided.

## 2019-06-09 NOTE — Progress Notes (Signed)
Charles Stafford, [place] [address]   CLINIC:  Medical Oncology/Hematology  PCP:  Lucia Gaskins, [place] [address] [phone]   REASON FOR VISIT:  Follow-up for right lung adenocarcinoma.    INTERVAL HISTORY:  Charles Stafford 85 y.o. male seen for follow-up of his lung cancer.  He is accompanied by his daughter.  He is not eating much.  He is losing weight and lost about 5 pounds since last visit.  He reports some pressure in the chest area but denies any pain.  He has cough.  He had some blood-tinged sputum since the biopsy.  His back pain is stable at 5/10.  No energy levels.  Daughter reports that he is not going out to smoke as much as previously.   REVIEW OF SYSTEMS:  Review of Systems  Respiratory: Positive for cough and shortness of breath.   Musculoskeletal: Positive for back pain.  All other systems reviewed and are negative.    PAST MEDICAL/SURGICAL HISTORY:  History reviewed. No pertinent past medical history. Past Surgical History:  Procedure Laterality Date  . BRONCHIAL BRUSHINGS Right 04/25/2019   Procedure: BRONCHIAL BRUSHINGS;  Surgeon: Sinda Du, MD;  Location: [facility];  Service: Cardiopulmonary;  Laterality: Right;  . BRONCHIAL WASHINGS Right 04/25/2019   Procedure: BRONCHIAL WASHINGS;  Surgeon: Sinda Du, MD;  Location: [facility];  Service: Cardiopulmonary;  Laterality: Right;  . FLEXIBLE BRONCHOSCOPY Bilateral 04/25/2019   Procedure: FLEXIBLE BRONCHOSCOPY;  Surgeon: Sinda Du, MD;  Location: [facility];  Service: Cardiopulmonary;  Laterality: Bilateral;     SOCIAL HISTORY:  Social History   Socioeconomic History  . Marital status: Legally Separated    Spouse name: Not on file  . Number of children: 5  . Years of education: Not on file  . Highest education level: Not on file  Occupational History  . Not on file  Social Needs  . Financial resource strain: Not  hard at all  . Food insecurity    Worry: Never true    Inability: Never true  . Transportation needs    Medical: No    Non-medical: No  Tobacco Use  . Smoking status: Current Every Day Smoker    Types: Cigarettes  . Smokeless tobacco: Never Used  Substance and Sexual Activity  . Alcohol use: Never    Frequency: Never  . Drug use: Never  . Sexual activity: Not on file  Lifestyle  . Physical activity    Days per week: 0 days    Minutes per session: 0 min  . Stress: Not at all  Relationships  . Social Herbalist on phone: Twice a week    Gets together: Twice a week    Attends religious service: 1 to 4 times per year    Active member of club or organization: No    Attends meetings of clubs or organizations: Never    Relationship status: Separated  . Intimate partner violence    Fear of current or ex partner: No    Emotionally abused: No    Physically abused: No    Forced sexual activity: No  Other Topics Concern  . Not on file  Social History Narrative  . Not on file    FAMILY HISTORY:  History reviewed. No pertinent family history.  CURRENT MEDICATIONS:  Outpatient Encounter Medications as of 06/09/2019  Medication Sig  . Albuterol Sulfate 108 (90 Base) MCG/ACT AEPB Inhale  2 puffs into the lungs 4 (four) times daily.  Marland Kitchen dronabinol (MARINOL) 2.5 MG capsule Take 1 capsule (2.5 mg total) by mouth 2 (two) times daily before a meal.  . varenicline (CHANTIX) 1 MG tablet Take 1 mg by mouth 2 (two) times daily. Pt has not started yet   No facility-administered encounter medications on file as of 06/09/2019.     ALLERGIES:  No Known Allergies   PHYSICAL EXAM:  ECOG Performance status: 2  Vitals:   06/09/19 1500  BP: 113/65  Pulse: (!) 57  Resp: 16  Temp: 97.9 F (36.6 C)  SpO2: (!) 83%   Filed Weights   06/09/19 1500  Weight: 100 lb 7 oz (45.6 kg)    Physical Exam Vitals signs reviewed.  Constitutional:      Appearance: Normal appearance.   Cardiovascular:     Rate and Rhythm: Normal rate and regular rhythm.     Heart sounds: Normal heart sounds.  Pulmonary:     Effort: Pulmonary effort is normal.     Breath sounds: Normal breath sounds.  Abdominal:     General: There is no distension.     Palpations: Abdomen is soft. There is no mass.  Musculoskeletal:        General: No swelling.  Skin:    General: Skin is warm.  Neurological:     General: No focal deficit present.     Mental Status: He is alert and oriented to person, place, and time.  Psychiatric:        Mood and Affect: Mood normal.        Behavior: Behavior normal.      LABORATORY DATA:  I have reviewed the labs as listed.  CBC    Component Value Date/Time   WBC 14.4 (H) 06/02/2019 0915   RBC 4.60 06/02/2019 0915   HGB 10.5 (L) 06/02/2019 0915   HCT 36.3 (L) 06/02/2019 0915   PLT 373 06/02/2019 0915   MCV 78.9 (L) 06/02/2019 0915   MCH 22.8 (L) 06/02/2019 0915   MCHC 28.9 (L) 06/02/2019 0915   RDW 21.7 (H) 06/02/2019 0915   LYMPHSABS 1.0 04/24/2019 0934   MONOABS 1.1 (H) 04/24/2019 0934   EOSABS 0.3 04/24/2019 0934   BASOSABS 0.1 04/24/2019 0934   CMP Latest Ref Rng & Units 04/26/2019 04/25/2019 04/24/2019  Glucose 70 - 99 mg/dL 137(H) 162(H) 141(H)  BUN 8 - 23 mg/dL 35(H) 37(H) 27(H)  Creatinine 0.61 - 1.24 mg/dL 0.89 1.07 0.92  Sodium 135 - 145 mmol/L 136 131(L) 135  Potassium 3.5 - 5.1 mmol/L 5.0 4.6 4.3  Chloride 98 - 111 mmol/L 102 95(L) 96(L)  CO2 22 - 32 mmol/L _0 Calcium 8.9 - 10.3 mg/dL 8.6(L) 8.7(L) 8.7(L)  Total Protein 6.5 - 8.1 g/dL - 6.6 -  Total Bilirubin 0.3 - 1.2 mg/dL - 0.4 -  Alkaline Phos 38 - 126 U/L - 121 -  AST 15 - 41 U/L - 15 -  ALT 0 - 44 U/L - 12 -       DIAGNOSTIC IMAGING:  I have independently reviewed the scans and discussed with the patient.   I have reviewed Venita Lick LPN's note and agree with the documentation.  I personally performed a face-to-face visit, made revisions and my assessment  and plan is as follows.    ASSESSMENT & PLAN:   Malignant neoplasm of right lung (HCC) 1.  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 - PET scan on 05/19/2019 shows intensely hypermetabolic right upper lobe mass consistent with primary bronchogenic carcinoma.  Hypermetabolic ipsilateral and contralateral mediastinal nodal metastasis.  Hypermetabolic pleural thickening in the right hemithorax. -CT of the head on 05/13/2019 was negative for brain metastasis. - Biopsy on 06/02/2019 shows non-small cell lung cancer, positive for TTF-1 consistent with lung adenocarcinoma. -We talked about the normal prognosis of stage IV lung cancer.  Patient does not want to get treatment at this time.  However he is willing to consider if he is a candidate for immunotherapy.  We will send tumor for PDL 1 and foundation 1 testing. - I have also offered him palliative radiation therapy.  He does not want to consider it at this time.  He reports that it is not causing him any pain but has pressure sensation.  We will tentatively schedule him for 3 to 4 weeks. - If he is not willing to consider any treatment at that time, will refer to hospice.  2.  Weight loss: -He had 20 pound weight loss in the last several months. - He did not like the taste of Ensure or boost. -He is taking Marinol 2.5 mg twice daily.  It has not helped with the appetite whole lot.  However he is able to sleep well.  He lost about 5 pounds since last visit.  I have told him to increase Marinol to 2.5 mg in the morning and 5 mg in the evening.  Total time spent is 40 minutes with more than 50% of the time spent face-to-face discussing new diagnosis, treatment options and counseling and coordination of care.    Orders placed this encounter:  Orders Placed This Encounter  Procedures  . CBC with Differential/Platelet  . Comprehensive metabolic panel       Derek Jack, MD [place] [phone]

## 2019-06-09 NOTE — Assessment & Plan Note (Addendum)
1.  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 - PET scan on 05/19/2019 shows intensely hypermetabolic right upper lobe mass consistent with primary bronchogenic carcinoma.  Hypermetabolic ipsilateral and contralateral mediastinal nodal metastasis.  Hypermetabolic pleural thickening in the right hemithorax. -CT of the head on 05/13/2019 was negative for brain metastasis. - Biopsy on 06/02/2019 shows non-small cell lung cancer, positive for TTF-1 consistent with lung adenocarcinoma. -We talked about the normal prognosis of stage IV lung cancer.  Patient does not want to get treatment at this time.  However he is willing to consider if he is a candidate for immunotherapy.  We will send tumor for PDL 1 and foundation 1 testing. - I have also offered him palliative radiation therapy.  He does not want to consider it at this time.  He reports that it is not causing him any pain but has pressure sensation.  We will tentatively schedule him for 3 to 4 weeks. - If he is not willing to consider any treatment at that time, will refer to hospice.  2.  Weight loss: -He had 20 pound weight loss in the last several months. - He did not like the taste of Ensure or boost. -He is taking Marinol 2.5 mg twice daily.  It has not helped with the appetite whole lot.  However he is able to sleep well.  He lost about 5 pounds since last visit.  I have told him to increase Marinol to 2.5 mg in the morning and 5 mg in the evening.

## 2019-06-13 ENCOUNTER — Encounter (HOSPITAL_COMMUNITY): Payer: Self-pay | Admitting: *Deleted

## 2019-06-13 NOTE — Progress Notes (Signed)
Patient is going to Hospice, I have cancelled the testing for foundation one and PDL1.

## 2019-06-13 NOTE — Progress Notes (Signed)
I spoke with Charles Stafford in pathology and ordered Foundation One and PDL1 on accession # [id number].  Stage IV, Dx: C34.91.

## 2019-06-22 ENCOUNTER — Encounter (HOSPITAL_COMMUNITY): Payer: Self-pay | Admitting: Hematology

## 2019-07-02 DEATH — deceased

## 2019-07-05 ENCOUNTER — Encounter (HOSPITAL_COMMUNITY): Payer: Self-pay | Admitting: Hematology

## 2019-07-07 ENCOUNTER — Other Ambulatory Visit (HOSPITAL_COMMUNITY): Payer: Medicare Other

## 2019-07-07 ENCOUNTER — Ambulatory Visit (HOSPITAL_COMMUNITY): Payer: Medicare Other | Admitting: Hematology
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
## 2020-10-04 IMAGING — DX PORTABLE CHEST - 1 VIEW
1 series · 1 of 1 positions shown · non-contrast
Comparison: Portable exam 7838 hours compared to 04/24/2019

CLINICAL DATA: Post biopsy question pneumothorax

EXAM:
PORTABLE CHEST 1 VIEW

[chest ap]
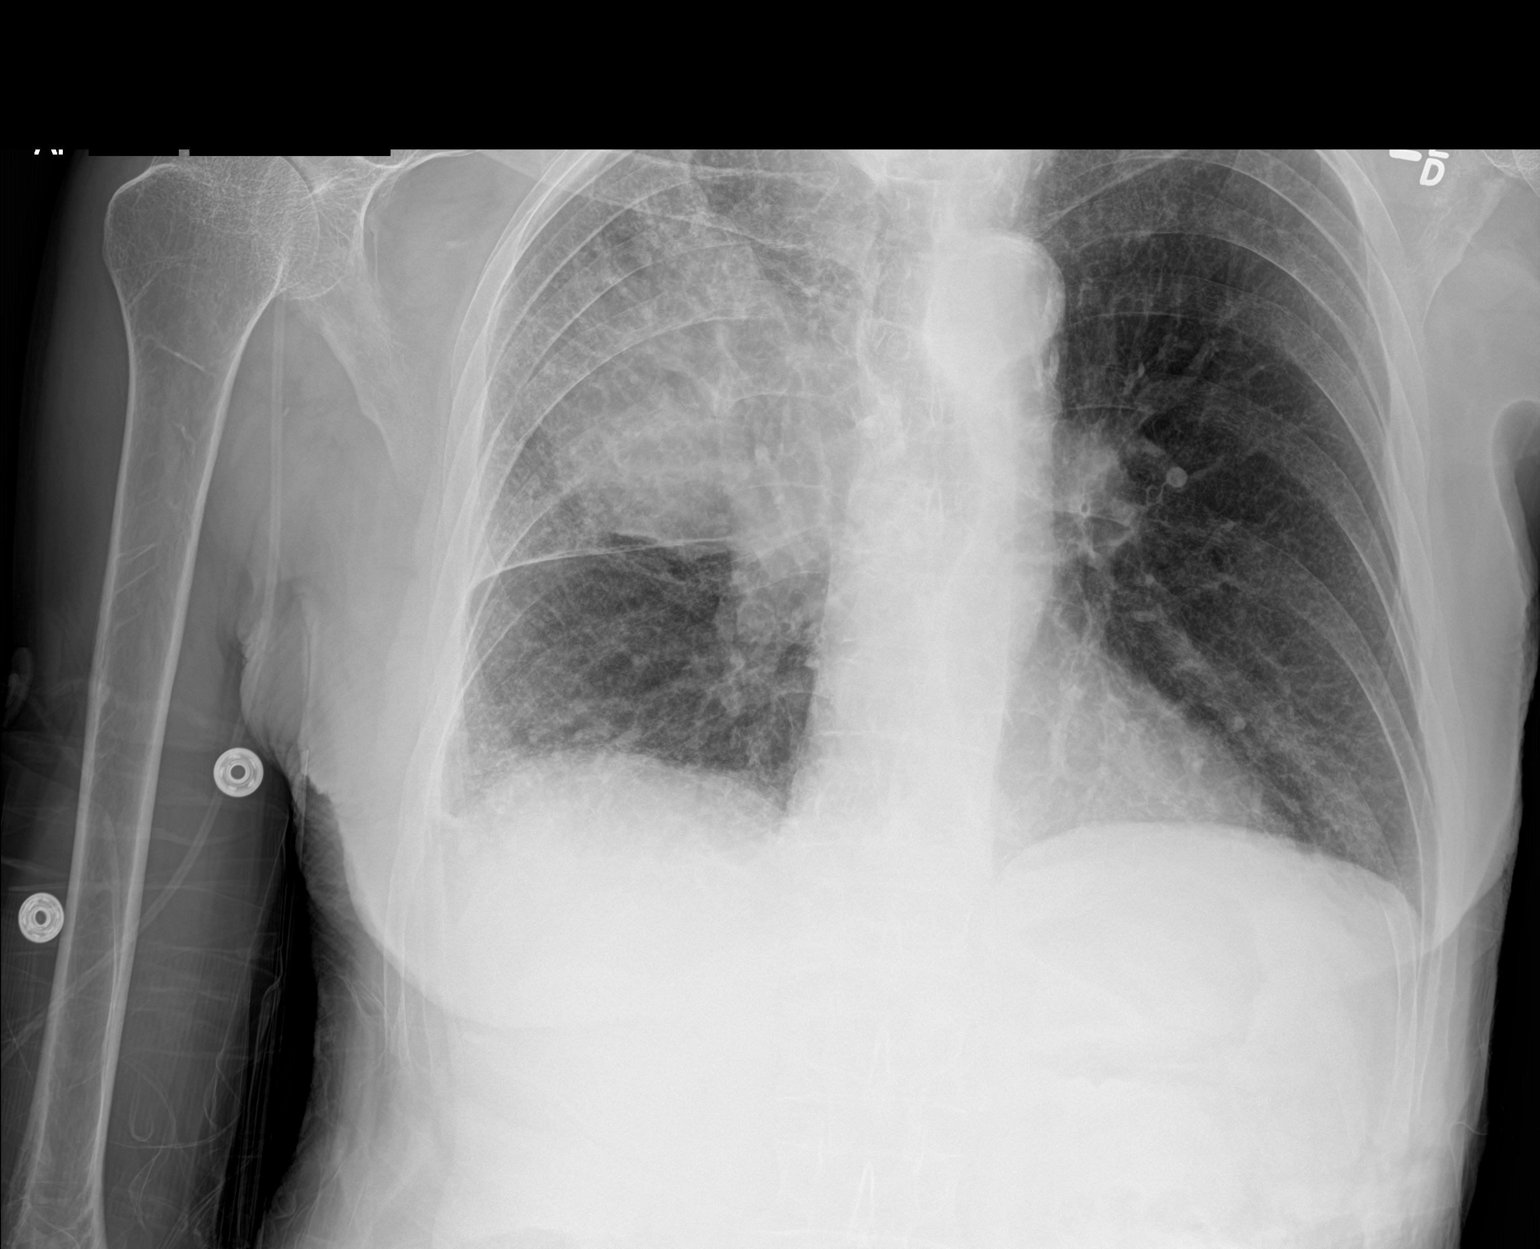

[1 of 1 positions shown; findings below may reference images not displayed]

FINDINGS: Normal heart size and pulmonary vascularity.

Atherosclerotic calcification aorta.

Again identified RIGHT upper lobe mass with mild surrounding
infiltrate.

Atelectasis versus infiltrate at RIGHT base.

Underlying emphysematous and bronchitic changes.

No pleural effusion or pneumothorax.

Bones demineralized.
IMPRESSION: No pneumothorax following biopsy.

COPD changes with again identified large anterior RIGHT upper lobe
mass.

Minimal infiltrate at RIGHT upper lobe and mild atelectasis versus
infiltrate at RIGHT base.

## 2022-10-31 DEATH — deceased
